# Patient Record
Sex: Female | Born: 1975
Health system: Southern US, Community
[De-identification: ages and names within clinical notes are randomized; demographics above are authoritative.]

## PROBLEM LIST (undated history)

## (undated) DIAGNOSIS — I10 Essential (primary) hypertension: Secondary | ICD-10-CM

## (undated) DIAGNOSIS — E079 Disorder of thyroid, unspecified: Secondary | ICD-10-CM

## (undated) DIAGNOSIS — L439 Lichen planus, unspecified: Secondary | ICD-10-CM

## (undated) DIAGNOSIS — K219 Gastro-esophageal reflux disease without esophagitis: Secondary | ICD-10-CM

## (undated) DIAGNOSIS — E663 Overweight: Secondary | ICD-10-CM

## (undated) HISTORY — DX: Essential (primary) hypertension: I10

## (undated) HISTORY — DX: Overweight: E66.3

## (undated) HISTORY — DX: Gastro-esophageal reflux disease without esophagitis: K21.9

## (undated) HISTORY — DX: Disorder of thyroid, unspecified: E07.9

## (undated) HISTORY — DX: Lichen planus, unspecified: L43.9

---

## 2010-08-29 ENCOUNTER — Ambulatory Visit: Payer: Self-pay | Admitting: Internal Medicine

## 2010-08-29 DIAGNOSIS — E669 Obesity, unspecified: Secondary | ICD-10-CM

## 2010-08-29 DIAGNOSIS — I10 Essential (primary) hypertension: Secondary | ICD-10-CM | POA: Insufficient documentation

## 2010-09-03 ENCOUNTER — Ambulatory Visit: Payer: Self-pay | Admitting: Family Medicine

## 2010-09-04 LAB — CONVERTED CEMR LAB
ALT: 17 units/L (ref 0–35)
BUN: 17 mg/dL (ref 6–23)
Basophils Absolute: 0 10*3/uL (ref 0.0–0.1)
Bilirubin, Direct: 0.1 mg/dL (ref 0.0–0.3)
Chloride: 106 meq/L (ref 96–112)
Cholesterol: 127 mg/dL (ref 0–200)
Creatinine, Ser: 0.8 mg/dL (ref 0.4–1.2)
Eosinophils Absolute: 0.2 10*3/uL (ref 0.0–0.7)
Eosinophils Relative: 2.8 % (ref 0.0–5.0)
Glucose, Bld: 89 mg/dL (ref 70–99)
LDL Cholesterol: 70 mg/dL (ref 0–99)
Lymphs Abs: 1.7 10*3/uL (ref 0.7–4.0)
MCV: 86.4 fL (ref 78.0–100.0)
Monocytes Absolute: 0.8 10*3/uL (ref 0.1–1.0)
Neutrophils Relative %: 66.5 % (ref 43.0–77.0)
Platelets: 230 10*3/uL (ref 150.0–400.0)
RDW: 14.3 % (ref 11.5–14.6)
TSH: 1.04 microintl units/mL (ref 0.35–5.50)
Total Bilirubin: 0.8 mg/dL (ref 0.3–1.2)
Triglycerides: 37 mg/dL (ref 0.0–149.0)
VLDL: 7.4 mg/dL (ref 0.0–40.0)
WBC: 8.2 10*3/uL (ref 4.5–10.5)

## 2010-10-30 NOTE — Assessment & Plan Note (Signed)
Summary: NEW PT TO [facility]   Vital Signs:  Patient profile:   35 year old female Height:      65 inches Weight:      170.25 pounds BMI:     28.43 Temp:     99.0 degrees F oral Pulse rate:   86 / minute Pulse rhythm:   regular BP sitting:   136 / 82  (left arm) Cuff size:   regular  Vitals Entered By: Selena Batten Dance CMA Duncan Dull) (August 29, 2010 10:19 AM) CC: New patient to establish care   History of Present Illness: CC: new patient, establish  developed cold, cough remained.  chest started hurting.  today feeling better.  sometimes feels heart pounding.  Feels chest soreness.    elevated bp in past - at [facility] has been 150-160 systolic.  overweight - knows needs to get into gym.    preventative -  flu shot last season, not currently.  will think about it. unsure about tetanus. hasn't had blood work checked in past. Well woman at [facility] - Dr. Abbe Amsterdam at [facility] physicians.  h/o abnl paps, removed abnl cells s/p LEEP.  Has had normal paps since then.  h/o preterm labor at 21 6 wks, lost son, twin daughter doing well.  Current Medications (verified): 1)  Depo-Provera 150 Mg/ml Susp (Medroxyprogesterone Acetate)  Allergies (verified): No Known Drug Allergies  Past History:  Past Medical History: Preterm labor  Past Surgical History: none  Family History: M: healthy F: DM, HTN MGM: CAD/MI 30yo PGM, aunt: BRCA (dx 64s) PGM: HTN  No CVA, other CA  Social History: No smokers, occasional EtOH, no rec drugs Caffeine: 3-4 cups coffee/day Occupation: Charity fundraiser at [employer], [address] Lives with husband and 1 healthy daughter at home (21wk premie, 2007), lost twin son, ab other twin pregnancy  Review of Systems  The patient denies anorexia, fever, weight loss, weight gain, vision loss, decreased hearing, hoarseness, chest pain, syncope, dyspnea on exertion, peripheral edema, prolonged cough, headaches, hemoptysis, abdominal pain, melena, hematochezia, severe indigestion/heartburn,  hematuria, difficulty walking, depression, and breast masses.    Physical Exam  General:  Well-developed,well-nourished,in no acute distress; alert,appropriate and cooperative throughout examination Head:  Normocephalic and atraumatic without obvious abnormalities. No apparent alopecia or balding. Eyes:  No corneal or conjunctival inflammation noted. EOMI. Perrla.  Ears:  TMs clear bilaterally Nose:  nares clear Mouth:  Oral mucosa and oropharynx without lesions or exudates.  Teeth in good repair.  MMM Neck:  No deformities, masses, or tenderness noted. Lungs:  Normal respiratory effort, chest expands symmetrically. Lungs are clear to auscultation, no crackles or wheezes. Heart:  Normal rate and regular rhythm. S1 and S2 normal without gallop, murmur, click, rub or other extra sounds. Abdomen:  Bowel sounds positive,abdomen soft and non-tender without masses, organomegaly or hernias noted. Msk:  No deformity or scoliosis noted of thoracic or lumbar spine.   Pulses:  2+ rad pulses Extremities:  no pedal edema Neurologic:  CN grossly intact, station and gait intact Skin:  Intact without suspicious lesions or rashes Psych:  full affect, pleasant and conversant   Impression & Recommendations:  Problem # 1:  ELEVATED BLOOD PRESSURE WITHOUT DIAGNOSIS OF HYPERTENSION (ICD-796.2) to keep log of bp at work, bring to next visit.  Instructed in low sodium diet  and behavior modification.    BP today: 136/82  Problem # 2:  OVERWEIGHT (ICD-278.02) check basic blood work when returns fasting.  discussed healthy eating and exercise, fm hx HTN, DM. Ht:  65 (08/29/2010)   Wt: 170.25 (08/29/2010)   BMI: 28.43 (08/29/2010)  Problem # 3:  CONTRACEPTIVE MANAGEMENT (ICD-V25.09) pt requests to receive depo here, will request records from Dr. Abbe Amsterdam at [facility] and may start next depo cycle.  Problem # 4:  HEALTH MAINTENANCE EXAM (ICD-V70.0) declines flu, will check on tetanus.  well woman - gets done at  [facility], last over summer.  normal paps per patient.  Complete Medication List: 1)  Depo-provera 150 Mg/ml Susp (Medroxyprogesterone acetate)  Patient Instructions: 1)  return fasting for blood work [CBC, CMP, FLP, TSH 796.2, 278.02] 2)  Good to meet you today! Call clinic with questions 3)  Return in 3-4 wks for blood pressure recheck.  Bring log.     Orders Added: 1)  New Patient 18-39 years [[id number]]    Prior Medications: Current Allergies (reviewed today): No known allergies

## 2012-09-30 DIAGNOSIS — L439 Lichen planus, unspecified: Secondary | ICD-10-CM

## 2012-09-30 HISTORY — DX: Lichen planus, unspecified: L43.9

## 2013-04-29 ENCOUNTER — Ambulatory Visit (INDEPENDENT_AMBULATORY_CARE_PROVIDER_SITE_OTHER): Payer: BC Managed Care – PPO | Admitting: Family Medicine

## 2013-04-29 ENCOUNTER — Encounter: Payer: Self-pay | Admitting: Family Medicine

## 2013-04-29 VITALS — BP 150/90 | HR 80 | Temp 98.7°F | Ht 65.0 in | Wt 164.5 lb

## 2013-04-29 DIAGNOSIS — E663 Overweight: Secondary | ICD-10-CM

## 2013-04-29 DIAGNOSIS — Z0100 Encounter for examination of eyes and vision without abnormal findings: Secondary | ICD-10-CM

## 2013-04-29 DIAGNOSIS — Z0000 Encounter for general adult medical examination without abnormal findings: Secondary | ICD-10-CM

## 2013-04-29 DIAGNOSIS — Z0279 Encounter for issue of other medical certificate: Secondary | ICD-10-CM

## 2013-04-29 DIAGNOSIS — R030 Elevated blood-pressure reading, without diagnosis of hypertension: Secondary | ICD-10-CM

## 2013-04-29 DIAGNOSIS — Z0110 Encounter for examination of ears and hearing without abnormal findings: Secondary | ICD-10-CM

## 2013-04-29 LAB — POCT URINALYSIS DIPSTICK
Bilirubin, UA: NEGATIVE
Glucose, UA: NEGATIVE
Ketones, UA: NEGATIVE
Nitrite, UA: NEGATIVE

## 2013-04-29 NOTE — Assessment & Plan Note (Signed)
Preventative protocols reviewed and updated unless pt declined. Discussed healthy diet and lifestyle.  Await [facility] to fill out form.

## 2013-04-29 NOTE — Patient Instructions (Addendum)
Let's keep track of blood pressure at local pharmacy - let me know if consistently >140/90 Blood work today. Urine looking ok. We will call you when results are in and form is ready to pick up

## 2013-04-29 NOTE — Progress Notes (Signed)
Subjective:    Patient ID: Jasmine Dixon, female    DOB: Jul 08, 1976, 37 y.o.   MRN: [id number]  HPI CC: CPE  Jasmine Dixon presents today for physical and to fill out form for school.  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  Wants to do FM.  Works [employer]00 at [employer].  BP elevated today - works night shifts.  Attributes to this.    Seat belt use discussed Avoids sun.  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 Abbe Amsterdam at [facility] physicians.  Normal pap smear recently. h/o abnl paps, removed abnl cells s/p LEEP. Has had normal paps since then.  h/o preterm labor at 21 6 wks, lost son, twin daughter doing well.   Medications and allergies reviewed and updated in chart.  Past histories reviewed and updated if relevant as below. Patient Active Problem List   Diagnosis Date Noted  . OVERWEIGHT 08/29/2010  . ELEVATED BLOOD PRESSURE WITHOUT DIAGNOSIS OF HYPERTENSION 08/29/2010   Past Medical History  Diagnosis Date  . Elevated blood pressure reading without diagnosis of hypertension   . Overweight(278.02)   . Preterm labor    History reviewed. No pertinent past surgical history. History  Substance Use Topics  . Smoking status: Never Smoker   . Smokeless tobacco: Never Used  . Alcohol Use: Yes     Comment: Occasional   Family History  Problem Relation Age of Onset  . Diabetes Father   . Hypertension Father   . CAD Maternal Grandmother   . Heart attack Maternal Grandmother 30  . Breast cancer Paternal Grandmother 95  . Breast cancer Paternal Aunt 32  . Hypertension Paternal Grandmother    No Known Allergies No current outpatient prescriptions on file prior to visit.   No current facility-administered  medications on file prior to visit.    Review of Systems  Constitutional: Negative for fever, chills, activity change, appetite change, fatigue and unexpected weight change.  HENT: Negative for hearing loss and neck pain.   Eyes: Negative for visual disturbance.  Respiratory: Negative for cough, chest tightness, shortness of breath and wheezing.   Cardiovascular: Negative for chest pain, palpitations and leg swelling.  Gastrointestinal: Negative for nausea, vomiting, abdominal pain, diarrhea, constipation, blood in stool and abdominal distention.  Genitourinary: Negative for hematuria and difficulty urinating.  Musculoskeletal: Negative for myalgias and arthralgias.  Skin: Negative for rash.  Neurological: Negative for dizziness, seizures, syncope and headaches.  Hematological: Negative for adenopathy. Does not bruise/bleed easily.  Psychiatric/Behavioral: Negative for dysphoric mood. The patient is not nervous/anxious.        Objective:   Physical Exam  Nursing note and vitals reviewed. Constitutional: She is oriented to person, place, and time. She appears well-developed and well-nourished. No distress.  HENT:  Head: Normocephalic and atraumatic.  Right Ear: Hearing, tympanic membrane, external ear and ear canal normal.  Left Ear: Hearing, tympanic membrane, external ear and ear canal normal.  Nose: Nose normal.  Mouth/Throat: Oropharynx is clear and moist. No oropharyngeal exudate.  Eyes: Conjunctivae and EOM are normal. Pupils are equal, round, and reactive to light. No scleral icterus.  Neck: Normal range of motion. Neck supple. No thyromegaly present.  Cardiovascular: Normal rate, regular rhythm, normal heart sounds and intact  distal pulses.   No murmur heard. Pulses:      Radial pulses are 2+ on the right side, and 2+ on the left side.  Pulmonary/Chest: Effort normal and breath sounds normal. No respiratory distress. She has no wheezes. She has no rales.  Abdominal: Soft.  Bowel sounds are normal. She exhibits no distension and no mass. There is no tenderness. There is no rebound and no guarding.  Musculoskeletal: Normal range of motion. She exhibits no edema.  Lymphadenopathy:    She has no cervical adenopathy.  Neurological: She is alert and oriented to person, place, and time.  CN grossly intact, station and gait intact  Skin: Skin is warm and dry. No rash noted.  Psychiatric: She has a normal mood and affect. Her behavior is normal. Judgment and thought content normal.       Assessment & Plan:

## 2013-04-29 NOTE — Assessment & Plan Note (Signed)
Body mass index is 27.37 kg/(m^2).  overall doing well. Wt Readings from Last 3 Encounters:  04/29/13 164 lb 8 oz (74.617 kg)  08/29/10 170 lb 4 oz (77.225 kg)

## 2013-04-29 NOTE — Assessment & Plan Note (Signed)
Pt will monitor closely at work, and notify me if persistently elevated. Check Cr and microalb today.

## 2013-04-30 LAB — MICROALBUMIN / CREATININE URINE RATIO
Creatinine,U: 193 mg/dL
Microalb Creat Ratio: 0.3 mg/g (ref 0.0–30.0)
Microalb, Ur: 0.5 mg/dL (ref 0.0–1.9)

## 2013-04-30 LAB — BASIC METABOLIC PANEL
BUN: 12 mg/dL (ref 6–23)
CO2: 24 mEq/L (ref 19–32)
Calcium: 9.3 mg/dL (ref 8.4–10.5)
Chloride: 107 mEq/L (ref 96–112)
Creatinine, Ser: 0.9 mg/dL (ref 0.4–1.2)
GFR: 76.63 mL/min (ref >60.00)
Glucose, Bld: 79 mg/dL (ref 70–99)
Potassium: 4.3 mEq/L (ref 3.5–5.1)
Sodium: 139 mEq/L (ref 135–145)

## 2013-04-30 LAB — CBC WITH DIFFERENTIAL/PLATELET
Basophils Absolute: 0 10*3/uL (ref 0.0–0.1)
Basophils Relative: 0.4 % (ref 0.0–3.0)
Eosinophils Absolute: 0.3 10*3/uL (ref 0.0–0.7)
Hemoglobin: 12.8 g/dL (ref 12.0–15.0)
Lymphocytes Relative: 30.7 % (ref 12.0–46.0)
MCHC: 32.5 g/dL (ref 30.0–36.0)
Monocytes Relative: 9.5 % (ref 3.0–12.0)
Neutro Abs: 4.1 10*3/uL (ref 1.4–7.7)
Neutrophils Relative %: 55.3 % (ref 43.0–77.0)
RBC: 4.47 Mil/uL (ref 3.87–5.11)
RDW: 13.7 % (ref 11.5–14.6)

## 2013-05-03 ENCOUNTER — Encounter: Payer: Self-pay | Admitting: *Deleted

## 2013-05-04 ENCOUNTER — Telehealth: Payer: Self-pay | Admitting: Family Medicine

## 2013-05-04 NOTE — Telephone Encounter (Signed)
Pt states that she got lab results in mail today.  Form that she brought during physical for school was supposed to be in with her labs but she did not receive it.  She says that she will come and pick it up if needed.

## 2013-05-04 NOTE — Telephone Encounter (Signed)
Dr. Reece Agar had not finished with paperwork when I mailed results. Patient notified and will pick up forms tomorrow.

## 2014-09-26 ENCOUNTER — Ambulatory Visit (INDEPENDENT_AMBULATORY_CARE_PROVIDER_SITE_OTHER): Payer: BC Managed Care – PPO | Admitting: Internal Medicine

## 2014-09-26 ENCOUNTER — Encounter: Payer: Self-pay | Admitting: Internal Medicine

## 2014-09-26 VITALS — BP 162/100 | HR 120 | Temp 98.1°F | Wt 185.0 lb

## 2014-09-26 DIAGNOSIS — I10 Essential (primary) hypertension: Secondary | ICD-10-CM

## 2014-09-26 DIAGNOSIS — R002 Palpitations: Secondary | ICD-10-CM

## 2014-09-26 LAB — COMPREHENSIVE METABOLIC PANEL
ALT: 18 U/L (ref 0–35)
AST: 23 U/L (ref 0–37)
Albumin: 4.5 g/dL (ref 3.5–5.2)
Alkaline Phosphatase: 48 U/L (ref 39–117)
BILIRUBIN TOTAL: 0.3 mg/dL (ref 0.2–1.2)
BUN: 11 mg/dL (ref 6–23)
CO2: 24 meq/L (ref 19–32)
CREATININE: 0.8 mg/dL (ref 0.4–1.2)
Calcium: 9.3 mg/dL (ref 8.4–10.5)
Chloride: 105 mEq/L (ref 96–112)
GFR: 87.24 mL/min (ref >60.00)
GLUCOSE: 91 mg/dL (ref 70–99)
Potassium: 3.9 mEq/L (ref 3.5–5.1)
Sodium: 136 mEq/L (ref 135–145)
Total Protein: 8 g/dL (ref 6.0–8.3)

## 2014-09-26 LAB — CBC
HEMATOCRIT: 40.8 % (ref 36.0–46.0)
Hemoglobin: 13.1 g/dL (ref 12.0–15.0)
MCHC: 32 g/dL (ref 30.0–36.0)
MCV: 87.2 fl (ref 78.0–100.0)
Platelets: 284 10*3/uL (ref 150.0–400.0)
RBC: 4.68 Mil/uL (ref 3.87–5.11)
RDW: 13.4 % (ref 11.5–15.5)
WBC: 11.2 10*3/uL — AB (ref 4.0–10.5)

## 2014-09-26 LAB — MAGNESIUM: Magnesium: 1.8 mg/dL (ref 1.5–2.5)

## 2014-09-26 LAB — TSH: TSH: 0.91 u[IU]/mL (ref 0.35–4.50)

## 2014-09-26 MED ORDER — LISINOPRIL-HYDROCHLOROTHIAZIDE 10-12.5 MG PO TABS: 1.0000 | ORAL_TABLET | Freq: Every day | ORAL | Status: DC

## 2014-09-26 NOTE — Progress Notes (Signed)
Pre visit review using our clinic review tool, if applicable. No additional management support is needed unless otherwise documented below in the visit note. 

## 2014-09-26 NOTE — Patient Instructions (Signed)

## 2014-09-26 NOTE — Progress Notes (Signed)
Subjective:    Patient ID: Jasmine Dixon, female    DOB: 30-Nov-1975, 38 y.o.   MRN: [id number]  HPI  Pt presents to the clinic today with c/o palpitations. She reports this started  1 month ago. It is intermittent but has been occuring more frequently over the last week. She denies blurred vision, dizziness, chest pain or shortness of breath. She does reports being "stressed out" due to school. Of note, her BP today is 162/100. She did have elevated blood pressure at her last visit 03/2013 of 150/90. She has also gained 20 lbs over that course of time. She has never been medicated for high blood pressure. She has not tried anything OTC.  Review of Systems      Past Medical History  Diagnosis Date  . Elevated blood pressure reading without diagnosis of hypertension   . Overweight(278.02)   . Preterm labor     Current Outpatient Prescriptions  Medication Sig Dispense Refill  . SPRINTEC 28 0.25-35 MG-MCG tablet Take 1 tablet by mouth daily.  11   No current facility-administered medications for this visit.    No Known Allergies  Family History  Problem Relation Age of Onset  . Diabetes Father   . Hypertension Father   . CAD Maternal Grandmother   . Heart attack Maternal Grandmother 30  . Breast cancer Paternal Grandmother 26  . Breast cancer Paternal Aunt 6  . Hypertension Paternal Grandmother     History   Social History  . Marital Status: Married    Spouse Name: N/A    Number of Children: 1  . Years of Education: N/A   Occupational History  . RN    Social History Main Topics  . Smoking status: Never Smoker   . Smokeless tobacco: Never Used  . Alcohol Use: 0.0 oz/week    0 Not specified per week     Comment: Occasional  . Drug Use: No  . Sexual Activity: Not on file   Other Topics Concern  . Not on file   Social History Narrative   3-4 cups coffee/day   Lives with husband and 1 healthy daughter at home (21 week preemie,2007), lost twin son,ab other  twin pregnacy   Occ: RN at [employer]   Activity: gym, has Physiological scientist   Diet: good water, fruits/vegetables daily, weight watchers     Constitutional: Pt reports weight gain. Denies fever, malaise, fatigue, headache.  Respiratory: Denies difficulty breathing, shortness of breath, cough or sputum production.   Cardiovascular: Pt reports palpitations. Denies chest pain, chest tightness or swelling in the hands or feet.  Neurological: Denies dizziness, difficulty with memory, difficulty with speech or problems with balance and coordination.   No other specific complaints in a complete review of systems (except as listed in HPI above).  Objective:   Physical Exam  BP 162/100 mmHg  Pulse 120  Temp(Src) 98.1 F (36.7 C) (Oral)  Wt 185 lb (83.915 kg)  SpO2 99%  LMP 09/14/2014 Wt Readings from Last 3 Encounters:  09/26/14 185 lb (83.915 kg)  04/29/13 164 lb 8 oz (74.617 kg)  08/29/10 170 lb 4 oz (77.225 kg)    General: Appears  her stated age, obese in NAD. Neck:  Neck supple, trachea midline. No masses, lumps or thyromegaly present.  Cardiovascular: Normal rate and rhythm. S1,S2 noted.  No murmur, rubs or gallops noted. No JVD or BLE edema. No carotid bruits noted. Pulmonary/Chest: Normal effort and positive vesicular breath sounds. No respiratory distress.  No wheezes, rales or ronchi noted.  Neurological: Alert and oriented.   BMET    Component Value Date/Time   NA 139 04/29/2013 1545   K 4.3 04/29/2013 1545   CL 107 04/29/2013 1545   CO2 24 04/29/2013 1545   GLUCOSE 79 04/29/2013 1545   BUN 12 04/29/2013 1545   CREATININE 0.9 04/29/2013 1545   CALCIUM 9.3 04/29/2013 1545   GFRNONAA 86.80 09/03/2010 0841    Lipid Panel     Component Value Date/Time   CHOL 127 09/03/2010 0841   TRIG 37.0 09/03/2010 0841   HDL 49.70 09/03/2010 0841   CHOLHDL 3 09/03/2010 0841   VLDL 7.4 09/03/2010 0841   LDLCALC 70 09/03/2010 0841    CBC    Component Value Date/Time   WBC  7.4 04/29/2013 1545   RBC 4.47 04/29/2013 1545   HGB 12.8 04/29/2013 1545   HCT 39.3 04/29/2013 1545   PLT 264.0 04/29/2013 1545   MCV 87.8 04/29/2013 1545   MCHC 32.5 04/29/2013 1545   RDW 13.7 04/29/2013 1545   LYMPHSABS 2.3 04/29/2013 1545   MONOABS 0.7 04/29/2013 1545   EOSABS 0.3 04/29/2013 1545   BASOSABS 0.0 04/29/2013 1545    Hgb A1C No results found for: HGBA1C       Assessment & Plan:  HTN with tachycardia and palpitations:  ECG: sinus tach with bigeminy Will check CBC, CMET, TSH and magnesium today Will treat BP with lisinopril-HCT Work on diet and exercise  RTC in 1 month to recheck blood pressure, or sooner if palpitations/tachycardia worsen, consider adding a beta blocker

## 2014-09-27 ENCOUNTER — Telehealth: Payer: Self-pay | Admitting: Family Medicine

## 2014-09-27 NOTE — Telephone Encounter (Signed)
emmi emailed °

## 2014-09-28 ENCOUNTER — Encounter: Payer: Self-pay | Admitting: Family Medicine

## 2014-10-01 NOTE — Telephone Encounter (Signed)
Scheduled patient for 11:30am. If she is unable to come in, plz cancel appt. Thanks.

## 2014-10-03 ENCOUNTER — Encounter: Payer: Self-pay | Admitting: Family Medicine

## 2014-10-03 ENCOUNTER — Ambulatory Visit (INDEPENDENT_AMBULATORY_CARE_PROVIDER_SITE_OTHER): Payer: BC Managed Care – PPO | Admitting: Family Medicine

## 2014-10-03 VITALS — BP 118/70 | HR 102 | Temp 98.0°F | Wt 184.5 lb

## 2014-10-03 DIAGNOSIS — R002 Palpitations: Secondary | ICD-10-CM

## 2014-10-03 DIAGNOSIS — I10 Essential (primary) hypertension: Secondary | ICD-10-CM

## 2014-10-03 DIAGNOSIS — E049 Nontoxic goiter, unspecified: Secondary | ICD-10-CM

## 2014-10-03 DIAGNOSIS — R12 Heartburn: Secondary | ICD-10-CM

## 2014-10-03 DIAGNOSIS — E010 Iodine-deficiency related diffuse (endemic) goiter: Secondary | ICD-10-CM | POA: Insufficient documentation

## 2014-10-03 MED ORDER — OMEPRAZOLE 20 MG PO CPDR: 20.0000 mg | DELAYED_RELEASE_CAPSULE | Freq: Every day | ORAL | Status: DC

## 2014-10-03 MED ORDER — METOPROLOL TARTRATE 25 MG PO TABS: 25.0000 mg | ORAL_TABLET | Freq: Two times a day (BID) | ORAL | Status: DC

## 2014-10-03 MED ORDER — LISINOPRIL 10 MG PO TABS
10.0000 mg | ORAL_TABLET | Freq: Every day | ORAL | Status: DC
Start: 2014-10-03 — End: 2014-10-27

## 2014-10-03 NOTE — Patient Instructions (Signed)
Keep next appointment. Let us know sooner if recurrent palpitations. Stop lisinopril hctz. Start lisinopril 10mg  daily along with metoprolol 25mg  twice daily. Return next week for labwork to recheck kidney function on meds.

## 2014-10-03 NOTE — Assessment & Plan Note (Signed)
On exam but normal TSH last week. Will regardless order thyroid US. Pt agrees with plan. Recheck TSH, free T4 next week when returns for f/u labs

## 2014-10-03 NOTE — Progress Notes (Signed)
BP 118/70 mmHg  Pulse 102  Temp(Src) 98 F (36.7 C) (Oral)  Wt 184 lb 8 oz (83.689 kg)  LMP 09/14/2014   CC: f/u palpitations   Subjective:    Patient ID: Jasmine Dixon, female    DOB: 1976/05/16, 39 y.o.   MRN: [id number]  HPI: Jasmine Dixon is a 39 y.o. female presenting on 10/03/2014 for Follow-up   See prior note and mychart message for details. Seen here last week with hypertension and tachycardia. Started on lisinopril hctz 10/12.5mg .  Scheduled appt today due to persistent palpitations and malaise, nausea. Actually feeling better today. No more palpitations since Saturday morning.   Palpitations described as irregular and fast.   On OCP sprintec since spring 2015. No fmhx blood clots. No recent prolonged travel. No fmhx thyroid disease. Denies leg swelling, dyspnea or exertional chest discomfort.  No headaches or vision changes, no leg swelling. No heat/cold intolerance, no tremors, no bowel changes/diarrhea/constipation, skin or hair changes.  Some GERD sxs - worse with weight gain.   Brings log of BP/HR:  HR 80-90s, highest 106 at rest. BP 113-129/73-85  20lb weight gain noted. To start seeing personal trainer.   No significant caffeine intake.   Relevant past medical, surgical, family and social history reviewed and updated as indicated. Interim medical history since our last visit reviewed. Allergies and medications reviewed and updated. Current Outpatient Prescriptions on File Prior to Visit  Medication Sig  . SPRINTEC 28 0.25-35 MG-MCG tablet Take 1 tablet by mouth daily.   No current facility-administered medications on file prior to visit.    Review of Systems Per HPI unless specifically indicated above     Objective:    BP 118/70 mmHg  Pulse 102  Temp(Src) 98 F (36.7 C) (Oral)  Wt 184 lb 8 oz (83.689 kg)  LMP 09/14/2014  Wt Readings from Last 3 Encounters:  10/03/14 184 lb 8 oz (83.689 kg)  09/26/14 185 lb (83.915 kg)  04/29/13 164 lb 8  oz (74.617 kg)    Physical Exam  Constitutional: She appears well-developed and well-nourished. No distress.  HENT:  Mouth/Throat: Oropharynx is clear and moist. No oropharyngeal exudate.  Neck: Normal range of motion. Neck supple. Thyromegaly present.  Cardiovascular: Normal rate, regular rhythm, normal heart sounds and intact distal pulses.   No murmur heard. Pulmonary/Chest: Effort normal and breath sounds normal. No respiratory distress. She has no wheezes. She has no rales.  Musculoskeletal: She exhibits no edema.  Lymphadenopathy:    She has no cervical adenopathy.  Skin: Skin is warm and dry. No rash noted.  Psychiatric: She has a normal mood and affect.  Nursing note and vitals reviewed.     Assessment & Plan:   Problem List Items Addressed This Visit    Thyromegaly    On exam but normal TSH last week. Will regardless order thyroid US. Pt agrees with plan. Recheck TSH, free T4 next week when returns for f/u labs    Relevant Medications      metoprolol tartrate (LOPRESSOR) tablet   Other Relevant Orders      US Soft Tissue Head/Neck      TSH      T4, free      T3   Palpitations - Primary    Improvement noted. Continue to monitor, if persistent palpitations will consider holter monitor. Doubt blood clot related.    Relevant Orders      EKG 12-Lead (Completed)   Hypertension, essential    Improved control  noted with lisinopril/hctz, and improvement of palpitations as well but persistent tachycardia noted. Will d/c combo pill, start plain lisinopril 10mg  and metformin 25mg  bid. Keep appt at end of month for recheck EKG and if persistent LVH/LAE, consider echocardiogram. Pt agrees with plan. EKG today - NSR rate high 90s, normal axis, intervals, LAE, ?LVH, nonspecific T changes    Relevant Medications      metoprolol tartrate (LOPRESSOR) tablet      lisinopril (PRINIVIL,ZESTRIL) tablet   Other Relevant Orders      TSH      Basic metabolic panel   Heartburn     Noticed increased sxs recently. Will start omeprazole 20mg  OTC daily.        Follow up plan: Return if symptoms worsen or fail to improve, for keep scheduled appointment.

## 2014-10-03 NOTE — Progress Notes (Signed)
Pre visit review using our clinic review tool, if applicable. No additional management support is needed unless otherwise documented below in the visit note. 

## 2014-10-04 ENCOUNTER — Encounter: Payer: Self-pay | Admitting: Family Medicine

## 2014-10-04 DIAGNOSIS — K219 Gastro-esophageal reflux disease without esophagitis: Secondary | ICD-10-CM | POA: Insufficient documentation

## 2014-10-04 NOTE — Assessment & Plan Note (Addendum)
Improvement noted. Continue to monitor, if persistent palpitations will consider holter monitor. Doubt blood clot related.

## 2014-10-04 NOTE — Assessment & Plan Note (Signed)
Improved control noted with lisinopril/hctz, and improvement of palpitations as well but persistent tachycardia noted. Will d/c combo pill, start plain lisinopril 10mg  and metformin 25mg  bid. Keep appt at end of month for recheck EKG and if persistent LVH/LAE, consider echocardiogram. Pt agrees with plan. EKG today - NSR rate high 90s, normal axis, intervals, LAE, ?LVH, nonspecific T changes

## 2014-10-04 NOTE — Assessment & Plan Note (Signed)
Noticed increased sxs recently. Will start omeprazole 20mg  OTC daily.

## 2014-10-05 ENCOUNTER — Ambulatory Visit
Admission: RE | Admit: 2014-10-05 | Discharge: 2014-10-05 | Disposition: A | Discharge status: Home / Self Care | Payer: BC Managed Care – PPO | Source: Ambulatory Visit | Attending: Family Medicine | Admitting: Family Medicine

## 2014-10-05 DIAGNOSIS — E010 Iodine-deficiency related diffuse (endemic) goiter: Secondary | ICD-10-CM

## 2014-10-11 ENCOUNTER — Other Ambulatory Visit (INDEPENDENT_AMBULATORY_CARE_PROVIDER_SITE_OTHER): Payer: BC Managed Care – PPO

## 2014-10-11 DIAGNOSIS — E010 Iodine-deficiency related diffuse (endemic) goiter: Secondary | ICD-10-CM

## 2014-10-11 DIAGNOSIS — E049 Nontoxic goiter, unspecified: Secondary | ICD-10-CM

## 2014-10-11 DIAGNOSIS — I10 Essential (primary) hypertension: Secondary | ICD-10-CM

## 2014-10-11 LAB — T4, FREE: Free T4: 0.87 ng/dL (ref 0.60–1.60)

## 2014-10-11 LAB — BASIC METABOLIC PANEL
BUN: 9 mg/dL (ref 6–23)
CO2: 23 meq/L (ref 19–32)
CREATININE: 0.8 mg/dL (ref 0.4–1.2)
Calcium: 9 mg/dL (ref 8.4–10.5)
Chloride: 104 mEq/L (ref 96–112)
GFR: 87.22 mL/min (ref >60.00)
Glucose, Bld: 81 mg/dL (ref 70–99)
Potassium: 3.8 mEq/L (ref 3.5–5.1)
SODIUM: 133 meq/L — AB (ref 135–145)

## 2014-10-11 LAB — TSH: TSH: 2.11 u[IU]/mL (ref 0.35–4.50)

## 2014-10-12 LAB — T3: T3 TOTAL: 164.3 ng/dL (ref 80.0–204.0)

## 2014-10-27 ENCOUNTER — Ambulatory Visit (INDEPENDENT_AMBULATORY_CARE_PROVIDER_SITE_OTHER): Payer: BC Managed Care – PPO | Admitting: Family Medicine

## 2014-10-27 ENCOUNTER — Encounter: Payer: Self-pay | Admitting: Family Medicine

## 2014-10-27 VITALS — BP 102/68 | HR 82 | Temp 98.2°F | Wt 181.5 lb

## 2014-10-27 DIAGNOSIS — I10 Essential (primary) hypertension: Secondary | ICD-10-CM

## 2014-10-27 DIAGNOSIS — E049 Nontoxic goiter, unspecified: Secondary | ICD-10-CM

## 2014-10-27 DIAGNOSIS — R002 Palpitations: Secondary | ICD-10-CM

## 2014-10-27 DIAGNOSIS — R9431 Abnormal electrocardiogram [ECG] [EKG]: Secondary | ICD-10-CM

## 2014-10-27 DIAGNOSIS — E010 Iodine-deficiency related diffuse (endemic) goiter: Secondary | ICD-10-CM

## 2014-10-27 MED ORDER — LISINOPRIL 5 MG PO TABS: 5.0000 mg | ORAL_TABLET | Freq: Every day | ORAL | Status: DC

## 2014-10-27 NOTE — Patient Instructions (Addendum)
I think we are doing well. EKG today. Let's decrease lisinopril to 5mg  daily (1/2 tablet until you run out then 5mg  dose will be at pharmacy). Return for physical in next few months.

## 2014-10-27 NOTE — Assessment & Plan Note (Signed)
Better control - a bit low today. Will decrease lisinopril to 5mg  daily, continue metoprolol 25mg  bid. Recheck in 3 months at CPE.

## 2014-10-27 NOTE — Progress Notes (Signed)
Pre visit review using our clinic review tool, if applicable. No additional management support is needed unless otherwise documented below in the visit note. 

## 2014-10-27 NOTE — Progress Notes (Addendum)
BP 102/68 mmHg  Pulse 82  Temp(Src) 98.2 F (36.8 C) (Oral)  Wt 181 lb 8 oz (82.328 kg)  LMP 10/19/2014   CC: 3wk f/u visit  Subjective:    Patient ID: Jasmine Dixon, female    DOB: [birth year], 39 y.o.   MRN: [id number]  HPI: Jasmine Dixon is a 39 y.o. female presenting on 10/27/2014 for Follow-up   See prior notes for details. Briefly, seen here x2 in last month with hypertension and tachycardia. Labwork returned overall normal including TSH and Hgb. We started her on lisinopril 10mg  daily and metoprolol 25mg  bid. Since we saw her, minimal palpitations maybe 2-3 in last few weeks,   bp at home ranging 110s/70s. Denies dizziness, headaches, chest pain, dyspnea.  EKG showed possible LVH/LAE - due for repeat and if abnormal plan was to obtain echocardiogram.   We also started omeprazole 20mg  OTC daily for endorsed heartburn sxs. Some persistent globus sensation despite omeprazole 20mg  daily for last 3 weeks. Never really hearburn or water brash, or acid reflux.  Relevant past medical, surgical, family and social history reviewed and updated as indicated. Interim medical history since our last visit reviewed. Allergies and medications reviewed and updated. Current Outpatient Prescriptions on File Prior to Visit  Medication Sig  . metoprolol tartrate (LOPRESSOR) 25 MG tablet Take 1 tablet (25 mg total) by mouth 2 (two) times daily.  Marland Kitchen omeprazole (PRILOSEC) 20 MG capsule Take 1 capsule (20 mg total) by mouth daily.  . SPRINTEC 28 0.25-35 MG-MCG tablet Take 1 tablet by mouth daily.   No current facility-administered medications on file prior to visit.    Review of Systems Per HPI unless specifically indicated above     Objective:    BP 102/68 mmHg  Pulse 82  Temp(Src) 98.2 F (36.8 C) (Oral)  Wt 181 lb 8 oz (82.328 kg)  LMP 10/19/2014  Wt Readings from Last 3 Encounters:  10/27/14 181 lb 8 oz (82.328 kg)  10/03/14 184 lb 8 oz (83.689 kg)  09/26/14 185 lb (83.915 kg)      Physical Exam  Constitutional: She appears well-developed and well-nourished. No distress.  HENT:  Mouth/Throat: Oropharynx is clear and moist. No oropharyngeal exudate.  Eyes: Conjunctivae and EOM are normal. Pupils are equal, round, and reactive to light.  Neck: Normal range of motion. Neck supple. No thyromegaly present.  Cardiovascular: Normal rate, regular rhythm, normal heart sounds and intact distal pulses.  Exam reveals no gallop.   No murmur heard. Pulmonary/Chest: Effort normal and breath sounds normal. No respiratory distress. She has no wheezes. She has no rales.  Musculoskeletal: She exhibits no edema.  Skin: Skin is warm and dry.  Psychiatric: She has a normal mood and affect.  Nursing note and vitals reviewed.  Results for orders placed or performed in visit on 10/11/14  TSH  Result Value Ref Range   TSH 2.11 0.35 - 4.50 uIU/mL  T4, free  Result Value Ref Range   Free T4 0.87 0.60 - 1.60 ng/dL  T3  Result Value Ref Range   T3, Total 164.3 80.0 - 204.0 ng/dL  Basic metabolic panel  Result Value Ref Range   Sodium 133 (L) 135 - 145 mEq/L   Potassium 3.8 3.5 - 5.1 mEq/L   Chloride 104 96 - 112 mEq/L   CO2 23 19 - 32 mEq/L   Glucose, Bld 81 70 - 99 mg/dL   BUN 9 6 - 23 mg/dL   Creatinine, Ser 0.8 0.4 - 1.2  mg/dL   Calcium 9.0 8.4 - 10.5 mg/dL   GFR 87.22 >60.00 mL/min      Assessment & Plan:   Problem List Items Addressed This Visit    RESOLVED: Thyromegaly    Thyroid US normal.      Palpitations - Primary    Initial EKG with ventricular bigeminy.  Rpt EKG with ?LAE - will rpt today and if persistently abnormal will check 2D echo.  If persistent tachyarrhythmias would refer for holter monitor - seems currently controlled with low dose beta blockade. Pt agrees with plan.   EKG - NSR rate 90s, normal axis, intervals, no acute ST/T changes, LVH, ?persistent LAE with p mitrale      Relevant Orders   EKG 12-Lead (Completed)   2D Echocardiogram without  contrast   Hypertension, essential    Better control - a bit low today. Will decrease lisinopril to 5mg  daily, continue metoprolol 25mg  bid. Recheck in 3 months at CPE.      Relevant Medications   lisinopril (PRINIVIL,ZESTRIL) tablet   Other Relevant Orders   2D Echocardiogram without contrast    Other Visit Diagnoses    Nonspecific abnormal electrocardiogram (ECG) (EKG)        Relevant Orders    2D Echocardiogram without contrast        Follow up plan: Return in about 3 months (around 01/26/2015), or if symptoms worsen or fail to improve, for annual exam, prior fasting for blood work.

## 2014-10-27 NOTE — Assessment & Plan Note (Signed)
Thyroid US normal

## 2014-10-27 NOTE — Assessment & Plan Note (Addendum)
Initial EKG with ventricular bigeminy.  Rpt EKG with ?LAE - will rpt today and if persistently abnormal will check 2D echo.  If persistent tachyarrhythmias would refer for holter monitor - seems currently controlled with low dose beta blockade. Pt agrees with plan.   EKG - NSR rate 90s, normal axis, intervals, no acute ST/T changes, LVH, ?persistent LAE with p mitrale

## 2014-10-30 NOTE — Addendum Note (Signed)
Addended by: Ria Bush on: 10/30/2014 08:17 PM   Modules accepted: Orders

## 2014-10-31 HISTORY — PX: US ECHOCARDIOGRAPHY: HXRAD669

## 2014-11-01 ENCOUNTER — Other Ambulatory Visit: Payer: Self-pay | Admitting: Radiology

## 2014-11-02 ENCOUNTER — Other Ambulatory Visit: Payer: Self-pay | Admitting: Radiology

## 2014-11-02 DIAGNOSIS — R9431 Abnormal electrocardiogram [ECG] [EKG]: Secondary | ICD-10-CM

## 2014-11-07 ENCOUNTER — Encounter: Payer: Self-pay | Admitting: Family Medicine

## 2014-11-21 ENCOUNTER — Other Ambulatory Visit: Payer: Self-pay

## 2014-11-21 ENCOUNTER — Encounter: Payer: Self-pay | Admitting: Family Medicine

## 2014-11-21 ENCOUNTER — Other Ambulatory Visit (INDEPENDENT_AMBULATORY_CARE_PROVIDER_SITE_OTHER): Payer: BC Managed Care – PPO

## 2014-11-21 DIAGNOSIS — R002 Palpitations: Secondary | ICD-10-CM

## 2014-11-21 DIAGNOSIS — R9431 Abnormal electrocardiogram [ECG] [EKG]: Secondary | ICD-10-CM

## 2015-01-28 ENCOUNTER — Other Ambulatory Visit: Payer: Self-pay | Admitting: Family Medicine

## 2015-01-28 DIAGNOSIS — I10 Essential (primary) hypertension: Secondary | ICD-10-CM

## 2015-01-30 ENCOUNTER — Other Ambulatory Visit (INDEPENDENT_AMBULATORY_CARE_PROVIDER_SITE_OTHER): Payer: BC Managed Care – PPO

## 2015-01-30 DIAGNOSIS — I10 Essential (primary) hypertension: Secondary | ICD-10-CM | POA: Diagnosis not present

## 2015-01-30 LAB — BASIC METABOLIC PANEL
BUN: 9 mg/dL (ref 6–23)
CO2: 26 mEq/L (ref 19–32)
Calcium: 8.9 mg/dL (ref 8.4–10.5)
Chloride: 103 mEq/L (ref 96–112)
Creatinine, Ser: 0.8 mg/dL (ref 0.40–1.20)
GFR: 85.82 mL/min (ref >60.00)
GLUCOSE: 81 mg/dL (ref 70–99)
POTASSIUM: 3.6 meq/L (ref 3.5–5.1)
Sodium: 136 mEq/L (ref 135–145)

## 2015-01-30 LAB — LIPID PANEL
CHOLESTEROL: 138 mg/dL (ref 0–200)
HDL: 44.2 mg/dL (ref >39.00)
LDL Cholesterol: 83 mg/dL (ref 0–99)
NonHDL: 93.8
Total CHOL/HDL Ratio: 3
Triglycerides: 54 mg/dL (ref 0.0–149.0)
VLDL: 10.8 mg/dL (ref 0.0–40.0)

## 2015-02-01 ENCOUNTER — Ambulatory Visit (INDEPENDENT_AMBULATORY_CARE_PROVIDER_SITE_OTHER): Payer: BC Managed Care – PPO | Admitting: Family Medicine

## 2015-02-01 ENCOUNTER — Encounter: Payer: Self-pay | Admitting: Family Medicine

## 2015-02-01 VITALS — BP 124/82 | HR 80 | Temp 98.0°F | Ht 65.0 in | Wt 181.2 lb

## 2015-02-01 DIAGNOSIS — I10 Essential (primary) hypertension: Secondary | ICD-10-CM

## 2015-02-01 DIAGNOSIS — Z0000 Encounter for general adult medical examination without abnormal findings: Secondary | ICD-10-CM

## 2015-02-01 DIAGNOSIS — E663 Overweight: Secondary | ICD-10-CM

## 2015-02-01 DIAGNOSIS — M205X9 Other deformities of toe(s) (acquired), unspecified foot: Secondary | ICD-10-CM

## 2015-02-01 DIAGNOSIS — M21629 Bunionette of unspecified foot: Secondary | ICD-10-CM | POA: Insufficient documentation

## 2015-02-01 DIAGNOSIS — R002 Palpitations: Secondary | ICD-10-CM

## 2015-02-01 MED ORDER — NORGESTIMATE-ETH ESTRADIOL 0.25-35 MG-MCG PO TABS: 1.0000 | ORAL_TABLET | Freq: Every day | ORAL | Status: DC

## 2015-02-01 NOTE — Assessment & Plan Note (Addendum)
Persistent issue - initial EKG 08/2014 with ?ventricular bigeminy. Echo overall normal, mild dilated LV but normal EF.  Palpitations may have recently been exacerbated after restarting workout routine last week.  Given persistent palpitations, will refer to cards for further evaluation ?holter.  Would consider titrating metoprolol and d/c lisinopril.

## 2015-02-01 NOTE — Progress Notes (Signed)
BP 124/82 mmHg  Pulse 80  Temp(Src) 98 F (36.7 C) (Oral)  Ht 5\' 5"  (1.651 m)  Wt 181 lb 4 oz (82.214 kg)  BMI 30.16 kg/m2  LMP 01/24/2015   CC: CPE  Subjective:    Patient ID: Jasmine Dixon, female    DOB: Mar 15, 1976, 39 y.o.   MRN: [id number]  HPI: Jasmine Dixon is a 39 y.o. female presenting on 02/01/2015 for Annual Exam   Persistent intermittent palpitations that last 2-3 hours. May have flared after she restarted workout routine with trainer last week. See prior notes - unrevealing workup with EKG Echo and labs.   Bilateral lateral foot pain unable to wear heels 2/2 pain.   Preventative: Well woman at [facility] - Dr. Georgiann Cocker at [facility] physicians. h/o abnl paps, removed abnl cells s/p LEEP. Normal since, now sees Q3 years. Flu shot yearly at work Tdap - 09/2012 Seat belt use discussed Avoids sun.  h/o preterm labor at 21 6 wks, lost son, twin daughter doing well.  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 Interim medical history since our last visit reviewed. Allergies and medications reviewed and updated. Current Outpatient Prescriptions on File Prior to Visit  Medication Sig  . lisinopril (PRINIVIL,ZESTRIL) 5 MG tablet Take 1 tablet (5 mg total) by mouth daily.  . metoprolol tartrate (LOPRESSOR) 25 MG tablet Take 1 tablet (25 mg total) by mouth 2 (two) times daily.  Marland Kitchen omeprazole (PRILOSEC) 20 MG capsule Take 1 capsule (20 mg total) by mouth daily.   No current facility-administered medications on file prior to visit.    Review of Systems  Constitutional: Negative for fever, chills, activity change, appetite change, fatigue and unexpected weight change.  HENT: Negative  for hearing loss.   Eyes: Negative for visual disturbance.  Respiratory: Negative for cough, chest tightness, shortness of breath and wheezing.   Cardiovascular: Positive for palpitations (occaisonal). Negative for chest pain and leg swelling.  Gastrointestinal: Negative for nausea, vomiting, abdominal pain, diarrhea, constipation, blood in stool and abdominal distention.  Genitourinary: Negative for hematuria and difficulty urinating.  Musculoskeletal: Negative for myalgias, arthralgias and neck pain.  Skin: Negative for rash.  Neurological: Negative for dizziness, seizures, syncope and headaches.  Hematological: Negative for adenopathy. Does not bruise/bleed easily.  Psychiatric/Behavioral: Negative for dysphoric mood. The patient is not nervous/anxious.    Per HPI unless specifically indicated above     Objective:    BP 124/82 mmHg  Pulse 80  Temp(Src) 98 F (36.7 C) (Oral)  Ht 5\' 5"  (1.651 m)  Wt 181 lb 4 oz (82.214 kg)  BMI 30.16 kg/m2  LMP 01/24/2015  Wt Readings from Last 3 Encounters:  02/01/15 181 lb 4 oz (82.214 kg)  10/27/14 181 lb 8 oz (82.328 kg)  10/03/14 184 lb 8 oz (83.689 kg)    Physical Exam  Constitutional: She is oriented to person, place, and time. She appears well-developed and well-nourished. No distress.  HENT:  Head: Normocephalic and atraumatic.  Right Ear: Hearing, tympanic membrane, external ear and ear canal normal.  Left Ear: Hearing, tympanic membrane, external ear and ear canal normal.  Nose: Nose normal.  Mouth/Throat: Uvula is midline, oropharynx is clear and moist and mucous membranes are normal. No oropharyngeal exudate,  posterior oropharyngeal edema or posterior oropharyngeal erythema.  Eyes: Conjunctivae and EOM are normal. Pupils are equal, round, and reactive to light. No scleral icterus.  Neck: Normal range of motion. Neck supple. No thyromegaly present.  Cardiovascular: Normal rate, regular rhythm, normal heart sounds and intact distal  pulses.   No murmur heard. Pulses:      Radial pulses are 2+ on the right side, and 2+ on the left side.  Pulmonary/Chest: Effort normal and breath sounds normal. No respiratory distress. She has no wheezes. She has no rales.  Abdominal: Soft. Bowel sounds are normal. She exhibits no distension and no mass. There is no tenderness. There is no rebound and no guarding.  Musculoskeletal: Normal range of motion. She exhibits no edema.  Bunionettes present bilaterally, no pain to palpation  Lymphadenopathy:    She has no cervical adenopathy.  Neurological: She is alert and oriented to person, place, and time.  CN grossly intact, station and gait intact  Skin: Skin is warm and dry. No rash noted.  Psychiatric: She has a normal mood and affect. Her behavior is normal. Judgment and thought content normal.  Nursing note and vitals reviewed.  Results for orders placed or performed in visit on 01/30/15  Lipid panel  Result Value Ref Range   Cholesterol 138 0 - 200 mg/dL   Triglycerides 54.0 0.0 - 149.0 mg/dL   HDL 44.20 >39.00 mg/dL   VLDL 10.8 0.0 - 40.0 mg/dL   LDL Cholesterol 83 0 - 99 mg/dL   Total CHOL/HDL Ratio 3    NonHDL 11.91   Basic metabolic panel  Result Value Ref Range   Sodium 136 135 - 145 mEq/L   Potassium 3.6 3.5 - 5.1 mEq/L   Chloride 103 96 - 112 mEq/L   CO2 26 19 - 32 mEq/L   Glucose, Bld 81 70 - 99 mg/dL   BUN 9 6 - 23 mg/dL   Creatinine, Ser 0.80 0.40 - 1.20 mg/dL   Calcium 8.9 8.4 - 10.5 mg/dL   GFR 85.82 >60.00 mL/min      Assessment & Plan:   Problem List Items Addressed This Visit    Palpitations    Persistent issue - initial EKG 08/2014 with ?ventricular bigeminy. Echo overall normal, mild dilated LV but normal EF.  Palpitations may have recently been exacerbated after restarting workout routine last week.  Given persistent palpitations, will refer to cards for further evaluation ?holter.  Would consider titrating metoprolol and d/c lisinopril.        Relevant Orders   Ambulatory referral to Cardiology   Overweight    Body mass index is 30.16 kg/(m^2).  Restarted exercise routine.      Hypertension, essential    Chronic, stable. Continue regimen.      Healthcare maintenance - Primary    Preventative protocols reviewed and updated unless pt declined. Discussed healthy diet and lifestyle.       Bunionette    Bilateral - bothersome, limit ability to use heels. Requests podiatry referral to discuss options for treatment.      Relevant Orders   Ambulatory referral to Podiatry       Follow up plan: Return in about 1 year (around 02/01/2016), or as needed, for annual exam, prior fasting for blood work.

## 2015-02-01 NOTE — Patient Instructions (Signed)
For persistent palpitations - we will refer you to cardiology for evaluation. If normal evaluation, we will discuss increasing metoprolol dose. For bunionettes - referral to podiatrist to discuss options. Good to see you today, call us with quesitons.

## 2015-02-01 NOTE — Assessment & Plan Note (Signed)
Body mass index is 30.16 kg/(m^2).  Restarted exercise routine.

## 2015-02-01 NOTE — Assessment & Plan Note (Signed)
Chronic, stable. Continue regimen. 

## 2015-02-01 NOTE — Assessment & Plan Note (Signed)
Preventative protocols reviewed and updated unless pt declined. Discussed healthy diet and lifestyle.  

## 2015-02-01 NOTE — Progress Notes (Signed)
Pre visit review using our clinic review tool, if applicable. No additional management support is needed unless otherwise documented below in the visit note. 

## 2015-02-01 NOTE — Assessment & Plan Note (Signed)
Bilateral - bothersome, limit ability to use heels. Requests podiatry referral to discuss options for treatment.

## 2015-02-06 ENCOUNTER — Ambulatory Visit (INDEPENDENT_AMBULATORY_CARE_PROVIDER_SITE_OTHER): Payer: BC Managed Care – PPO | Admitting: Podiatry

## 2015-02-06 ENCOUNTER — Encounter: Payer: Self-pay | Admitting: Podiatry

## 2015-02-06 ENCOUNTER — Ambulatory Visit (INDEPENDENT_AMBULATORY_CARE_PROVIDER_SITE_OTHER): Payer: BC Managed Care – PPO

## 2015-02-06 VITALS — BP 133/89 | HR 88 | Resp 16 | Ht 65.0 in | Wt 181.0 lb

## 2015-02-06 DIAGNOSIS — M2010 Hallux valgus (acquired), unspecified foot: Secondary | ICD-10-CM

## 2015-02-06 DIAGNOSIS — M205X9 Other deformities of toe(s) (acquired), unspecified foot: Secondary | ICD-10-CM | POA: Diagnosis not present

## 2015-02-06 DIAGNOSIS — M21629 Bunionette of unspecified foot: Secondary | ICD-10-CM

## 2015-02-06 NOTE — Progress Notes (Signed)
   Subjective:    Patient ID: Jasmine Dixon, female    DOB: 1976-01-22, 39 y.o.   MRN: [id number]  HPI Bilateral foot pain along the sides of my feet , right greater than left. Dress shoes seem to make it worse. The side near the pinky toe . It has been going on for at least 6 months    Review of Systems     Objective:   Physical Exam: I have reviewed her past medical history medications allergies surgery social history and review of systems. Pulses are strongly palpable bilateral. Neurologic sensorium is intact bilateral. Deep tendon reflexes are intact bilateral and muscle strength +5 over 5 dorsiflexion plantar flexors and inverters and everters all intrinsic musculature appears to be intact. Orthopedic evaluation demonstrates all joints distal to the ankle level range of motion without crepitation. She has rectus foot type bilateral with mild hallux valgus deformities bilaterally. However the majority of her symptoms are associated with fifth metatarsophalangeal joints where an overlying neuritis is noted. She also has tailor bunion deformities which are confirmed by radiographs. No other osseous abnormalities other than the after mentioned hallux valgus deformities. Cutaneous evaluation of his wrist supple well-hydrated Q is no erythema edema cellulitis drainage or odor.        Assessment & Plan:  Assessment: Tailor's bunion deformities bilateral.  Plan: Discussed etiology pathology conservative versus surgical therapies. At this point we consented her for surgical fifth metatarsal osteotomies with screw fixation. I answered all of her questions regarding this procedure to the best of my ability in layman's terms. She understood this was amenable to it and signed altered pages of the consent form. We did discuss the possible postop complications which may include but are not limited to postop pain leading swelling infection recurrence and need for further surgery. We also discussed the  possible DVT and PE risk. I will follow-up with her in the near future for surgery.

## 2015-02-07 ENCOUNTER — Encounter: Payer: Self-pay | Admitting: Podiatry

## 2015-02-08 ENCOUNTER — Telehealth: Payer: Self-pay | Admitting: *Deleted

## 2015-02-08 NOTE — Telephone Encounter (Signed)
PA required for Omeprazole. Completed through covermymeds. Will await determination.

## 2015-02-21 NOTE — Telephone Encounter (Signed)
PA denied. Will need substitute.

## 2015-03-01 ENCOUNTER — Encounter: Payer: Self-pay | Admitting: Family Medicine

## 2015-03-01 ENCOUNTER — Encounter: Payer: Self-pay | Admitting: Cardiology

## 2015-03-01 ENCOUNTER — Ambulatory Visit (INDEPENDENT_AMBULATORY_CARE_PROVIDER_SITE_OTHER): Payer: BC Managed Care – PPO | Admitting: Cardiology

## 2015-03-01 VITALS — BP 130/90 | HR 74 | Ht 65.0 in | Wt 176.2 lb

## 2015-03-01 DIAGNOSIS — I493 Ventricular premature depolarization: Secondary | ICD-10-CM

## 2015-03-01 DIAGNOSIS — I10 Essential (primary) hypertension: Secondary | ICD-10-CM | POA: Diagnosis not present

## 2015-03-01 DIAGNOSIS — R002 Palpitations: Secondary | ICD-10-CM

## 2015-03-01 MED ORDER — NORGESTIMATE-ETH ESTRADIOL 0.25-35 MG-MCG PO TABS: 1.0000 | ORAL_TABLET | Freq: Every day | ORAL | Status: DC

## 2015-03-01 NOTE — Patient Instructions (Signed)
Medication Instructions:  Your physician recommends that you continue on your current medications as directed. Please refer to the Current Medication list given to you today.  Labwork: none  Testing/Procedures: Your physician has requested that you have an exercise tolerance test.  Do not take your metoprolol the night before or the day of the stress test. You may bring it with you to take after your test.   Follow-Up: Your physician recommends that you schedule a follow-up appointment with Dr. Ellyn Hack after stress test    Any Other Special Instructions Will Be Listed Below (If Applicable).  Exercise Stress Electrocardiogram  An exercise stress electrocardiogram is a test that is done to evaluate the blood supply to your heart. This test may also be called exercise stress electrocardiography. The test is done while you are walking on a treadmill. The goal of this test is to raise your heart rate. This test is done to find areas of poor blood flow to the heart by determining the extent of coronary artery disease (CAD).    CAD is defined as narrowing in one or more heart (coronary) arteries of more than 70%. If you have an abnormal test result, this may mean that you are not getting adequate blood flow to your heart during exercise. Additional testing may be needed to understand why your test was abnormal.  LET [facility] CARE PROVIDER KNOW ABOUT:  Any allergies you have.  All medicines you are taking, including vitamins, herbs, eye drops, creams, and over-the-counter medicines.  Previous problems you or members of your family have had with the use of anesthetics.  Any blood disorders you have.  Previous surgeries you have had.  Medical conditions you have.  Possibility of pregnancy, if this applies. RISKS AND COMPLICATIONS  Generally, this is a safe procedure. However, as with any procedure, complications can occur. Possible complications can include:  Pain or pressure in the following  areas:  Chest.  Jaw or neck.  Between your shoulder blades.  Radiating down your left arm. Dizziness or light-headedness.  Shortness of breath.  Increased or irregular heartbeats.  Nausea or vomiting.  Heart attack (rare). BEFORE THE PROCEDURE  Avoid all forms of caffeine 24 hours before your test or as directed by your health care provider. This includes coffee, tea (even decaffeinated tea), caffeinated sodas, chocolate, cocoa, and certain pain medicines.  Follow your health care provider's instructions regarding eating and drinking before the test.  Take your medicines as directed at regular times with water unless instructed otherwise. Exceptions may include:  If you have diabetes, ask how you are to take your insulin or pills. It is common to adjust insulin dosing the morning of the test.  If you are taking beta-blocker medicines, it is important to talk to your health care provider about these medicines well before the date of your test. Taking beta-blocker medicines may interfere with the test. In some cases, these medicines need to be changed or stopped 24 hours or more before the test.  If you wear a nitroglycerin patch, it may need to be removed prior to the test. Ask your health care provider if the patch should be removed before the test. If you use an inhaler for any breathing condition, bring it with you to the test.  If you are an outpatient, bring a snack so you can eat right after the stress phase of the test.  Do not smoke for 4 hours prior to the test or as directed by your health  care provider.  Do not apply lotions, powders, creams, or oils on your chest prior to the test.  Wear loose-fitting clothes and comfortable shoes for the test. This test involves walking on a treadmill. PROCEDURE  Multiple patches (electrodes) will be put on your chest. If needed, small areas of your chest may have to be shaved to get better contact with the electrodes. Once the electrodes are  attached to your body, multiple wires will be attached to the electrodes and your heart rate will be monitored.  Your heart will be monitored both at rest and while exercising.  You will walk on a treadmill. The treadmill will be started at a slow pace. The treadmill speed and incline will gradually be increased to raise your heart rate. AFTER THE PROCEDURE  Your heart rate and blood pressure will be monitored after the test.  You may return to your normal schedule including diet, activities, and medicines, unless your health care provider tells you otherwise.

## 2015-03-01 NOTE — Progress Notes (Signed)
PATIENT: Jasmine Dixon MRN: [id number] DOB: [birth year] PCP: Ria Bush, MD  Clinic Note: Chief Complaint  Patient presents with  . toher    Per PCP due to palpitations. Meds reviewed verbally with pt.    HPI: Jasmine Dixon is a 39 y.o. female with a PMH below who presents today for initial cardiac evaluation for palpitations/frequent PVCs.  She was initially evaluated by her PCP back in December with a similar complaint of palpitations. Apparently she been very stressed out at school. She been noticing intermittent palpitations but was feeling very poorly when she went to the doctor's office that day. Noted to have high blood pressures and dizziness. So noted significant weight gain at that time.  Her EKG at that time showed ventricular bigeminy - apparently RVOT pattern of PVCs.  She had an echocardiogram performed that was relatively normal.  She was started on metoprolol and since then has had minimal residual palpitations. She's not had any further episodes of lightheadedness and dizziness.  She is currently in a weight loss program with dietary modification, and is now back to exercising.  She denies any fatigue or exercise intolerance related to the beta blocker use.   The remainder of Cardiovascular ROS of systems is as follows: : no chest pain or dyspnea on exertion positive for - Rare palpitations now negative for - edema, loss of consciousness, murmur, orthopnea, paroxysmal nocturnal dyspnea, rapid heart rate, shortness of breath or Syncope/near syncope, TIA/amaurosis fugax :   Past Medical History  Diagnosis Date  . Elevated blood pressure reading without diagnosis of hypertension   . Overweight(278.02)   . Preterm labor   . Lichen planus 9937    back, forearms, and legs  . Hypertension     Prior Cardiac Evaluation and Past Surgical History: Past Surgical History  Procedure Laterality Date  . US echocardiography  10/2014    WNL, mildly dilated LV, EFF  55-60%    No Known Allergies  Current Outpatient Prescriptions  Medication Sig Dispense Refill  . lisinopril (PRINIVIL,ZESTRIL) 5 MG tablet Take 1 tablet (5 mg total) by mouth daily. 30 tablet 11  . metoprolol tartrate (LOPRESSOR) 25 MG tablet Take 1 tablet (25 mg total) by mouth 2 (two) times daily. 60 tablet 3  . norgestimate-ethinyl estradiol (SPRINTEC 28) 0.25-35 MG-MCG tablet Take 1 tablet by mouth daily. 1 Package 11  . omeprazole (PRILOSEC) 40 MG capsule Take 1 capsule (40 mg total) by mouth daily. 30 capsule 3   No current facility-administered medications for this visit.    History   Social History Narrative   3-4 cups coffee/day   Lives with husband and 1 healthy daughter at home (21 week preemie,2007), lost twin son,ab other twin pregnacy   Occ: Therapist, sports at [employer], finishing NP school   Edu: finishing NP school   Activity: gym, has Physiological scientist   Diet: good water, fruits/vegetables daily, weight watchers    family history includes Breast cancer (age of onset: 47) in her paternal aunt and paternal grandmother; CAD in her maternal grandmother; Diabetes in her father; Heart attack (age of onset: 75) in her maternal grandmother; Hypertension in her father and paternal grandmother.  ROS: A comprehensive Review of Systems - was performed Review of Systems  Constitutional: Negative for malaise/fatigue.  Respiratory: Negative for cough.   Gastrointestinal: Negative for blood in stool and melena.  Genitourinary: Negative for hematuria.  Neurological: Negative for dizziness.  Psychiatric/Behavioral: Negative for depression.  All other systems reviewed and are negative.  PHYSICAL EXAM BP 130/90 mmHg  Pulse 74  Ht 5\' 5"  (1.651 m)  Wt 79.946 kg (176 lb 4 oz)  BMI 29.33 kg/m2  LMP 01/24/2015 General appearance: alert, cooperative, appears stated age, no distress and Very pleasant mood and affect. Healthy-appearing. Neck: no adenopathy, no carotid bruit, no JVD, supple,  symmetrical, trachea midline and thyroid not enlarged, symmetric, no tenderness/mass/nodules Lungs: clear to auscultation bilaterally and normal percussion bilaterally Heart: regular rate and rhythm, S1, S2 normal, no murmur, click, rub or gallop and normal apical impulse Abdomen: soft, non-tender; bowel sounds normal; no masses,  no organomegaly Extremities: extremities normal, atraumatic, no cyanosis or edema Pulses: 2+ and symmetric Skin: Skin color, texture, turgor normal. No rashes or lesions Neurologic: Alert and oriented X 3, normal strength and tone. Normal symmetric reflexes. Normal coordination and gait   Adult ECG Report  Rate 74  Rhythm: normal sinus rhythm and Low voltage but otherwise normal EKG with normal axis, intervals and durations.   Narrative Interpretation Normal EKG  Recent Labs: reviewed  Lab Results  Component Value Date   CHOL 138 01/30/2015   HDL 44.20 01/30/2015   LDLCALC 83 01/30/2015   TRIG 54.0 01/30/2015   CHOLHDL 3 01/30/2015   ASSESSMENT / PLAN: Very pleasant young woman with history of PVCs and bigeminy pattern now well controlled with beta blocker. She had an evaluation with an echocardiogram that was essentially normal.   Problem List Items Addressed This Visit    Hypertension, essential (Chronic)    Relatively well-controlled on current medications. She still has elevated diastolic pressure today. Otherwise been well controlled. No change.      Palpitations    Most consistent with PVCs. She may have PACs but unlikely at 2 different abnormalities.  See above      Symptomatic PVCs - Primary (Chronic)    History of frequent PVCs and bigeminal pattern.  I reviewed the concept of left bundle-branch block pattern of PVCs being possibly RVOT mediated.  I would like to determine if these PVCs are exacerbated by activity. She has normal echo already.  PLAN:  GXT: She will hold her beta blocker the night before and morning of the test. The plan  will be to determine if there are PVCs, the repeat action/response of PVCs to exercise. She indicates that they do not get worse with exertion.  No since her PVCs are well controlled on the current dose of beta blocker, will continue current dose of beta blocker. However if they do become more frequent again, I would like to have her wear monitor in order to determine the extent of PVCs. More frequent PVCs in the course the day with this pattern could potentially LV dysfunction.      Relevant Orders   EKG 12-Lead (Completed)   Exercise Tolerance Test      No orders of the defined types were placed in this encounter.    Followup: following GXT    Doc Mandala W. Ellyn Hack, M.D., M.S. [facility]

## 2015-03-02 MED ORDER — OMEPRAZOLE 40 MG PO CPDR: 40.0000 mg | DELAYED_RELEASE_CAPSULE | Freq: Every day | ORAL | Status: DC

## 2015-03-02 NOTE — Telephone Encounter (Signed)
Have sent in omeprazole 40mg  daily to try - but may have patient take 20mg  omeprazole/prilosec OTC if not approved.

## 2015-03-02 NOTE — Addendum Note (Signed)
Addended by: Ria Bush on: 03/02/2015 09:32 AM   Modules accepted: Orders

## 2015-03-03 ENCOUNTER — Encounter: Payer: Self-pay | Admitting: Cardiology

## 2015-03-03 ENCOUNTER — Encounter: Payer: Self-pay | Admitting: *Deleted

## 2015-03-03 DIAGNOSIS — I493 Ventricular premature depolarization: Secondary | ICD-10-CM | POA: Insufficient documentation

## 2015-03-03 NOTE — Assessment & Plan Note (Signed)
Most consistent with PVCs. She may have PACs but unlikely at 2 different abnormalities.  See above

## 2015-03-03 NOTE — Assessment & Plan Note (Signed)
Relatively well-controlled on current medications. She still has elevated diastolic pressure today. Otherwise been well controlled. No change.

## 2015-03-03 NOTE — Assessment & Plan Note (Signed)
History of frequent PVCs and bigeminal pattern.  I reviewed the concept of left bundle-branch block pattern of PVCs being possibly RVOT mediated.  I would like to determine if these PVCs are exacerbated by activity. She has normal echo already.  PLAN:  GXT: She will hold her beta blocker the night before and morning of the test. The plan will be to determine if there are PVCs, the repeat action/response of PVCs to exercise. She indicates that they do not get worse with exertion.  No since her PVCs are well controlled on the current dose of beta blocker, will continue current dose of beta blocker. However if they do become more frequent again, I would like to have her wear monitor in order to determine the extent of PVCs. More frequent PVCs in the course the day with this pattern could potentially LV dysfunction.

## 2015-03-10 ENCOUNTER — Other Ambulatory Visit: Payer: Self-pay | Admitting: Family Medicine

## 2015-03-20 ENCOUNTER — Ambulatory Visit (INDEPENDENT_AMBULATORY_CARE_PROVIDER_SITE_OTHER): Payer: BC Managed Care – PPO

## 2015-03-20 DIAGNOSIS — I493 Ventricular premature depolarization: Secondary | ICD-10-CM | POA: Diagnosis not present

## 2015-03-20 LAB — EXERCISE TOLERANCE TEST
Estimated workload: 11.6 METS
Exercise duration (min): 10 min
Exercise duration (sec): 0 s
Peak HR: 160 {beats}/min
Percent HR: 87 %
Rest HR: 85 {beats}/min

## 2015-04-26 ENCOUNTER — Ambulatory Visit: Payer: BC Managed Care – PPO | Admitting: Cardiology

## 2015-10-03 ENCOUNTER — Other Ambulatory Visit: Payer: Self-pay | Admitting: Family Medicine

## 2015-11-13 ENCOUNTER — Other Ambulatory Visit: Payer: Self-pay | Admitting: Family Medicine

## 2016-01-27 ENCOUNTER — Other Ambulatory Visit: Payer: Self-pay | Admitting: Family Medicine

## 2016-01-27 DIAGNOSIS — I10 Essential (primary) hypertension: Secondary | ICD-10-CM

## 2016-01-27 DIAGNOSIS — R002 Palpitations: Secondary | ICD-10-CM

## 2016-01-29 ENCOUNTER — Other Ambulatory Visit: Payer: BC Managed Care – PPO

## 2016-02-02 ENCOUNTER — Encounter: Payer: BC Managed Care – PPO | Admitting: Family Medicine

## 2016-03-18 ENCOUNTER — Other Ambulatory Visit: Payer: Self-pay | Admitting: Family Medicine

## 2016-03-20 ENCOUNTER — Other Ambulatory Visit: Payer: Self-pay | Admitting: Family Medicine

## 2016-04-01 IMAGING — US US SOFT TISSUE HEAD/NECK
1 series · 14 of 25 positions shown · non-contrast
Comparison: None.

CLINICAL DATA: Thyroid enlargement

EXAM:
THYROID ULTRASOUND
TECHNIQUE: Ultrasound examination of the thyroid gland and adjacent soft
tissues was performed.

[Series 1: us soft tissue head/neck · 0.08mm/px · 14 of 38 slices shown]
[im 1/38]
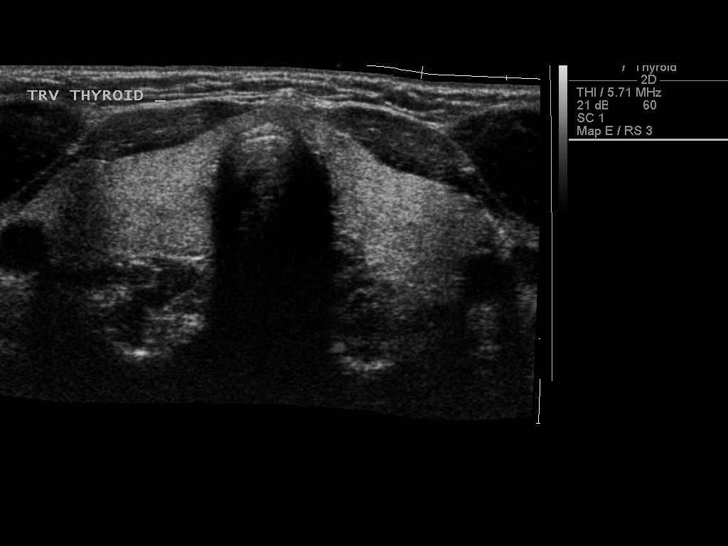
[im 4/38]
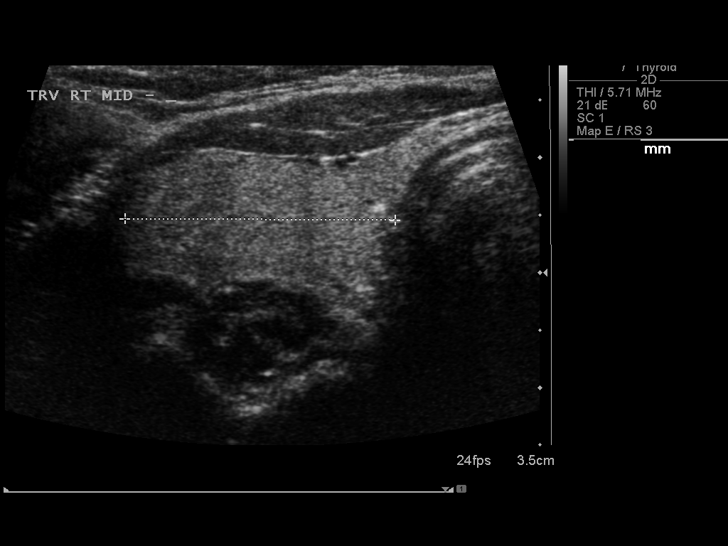
[im 7/38]
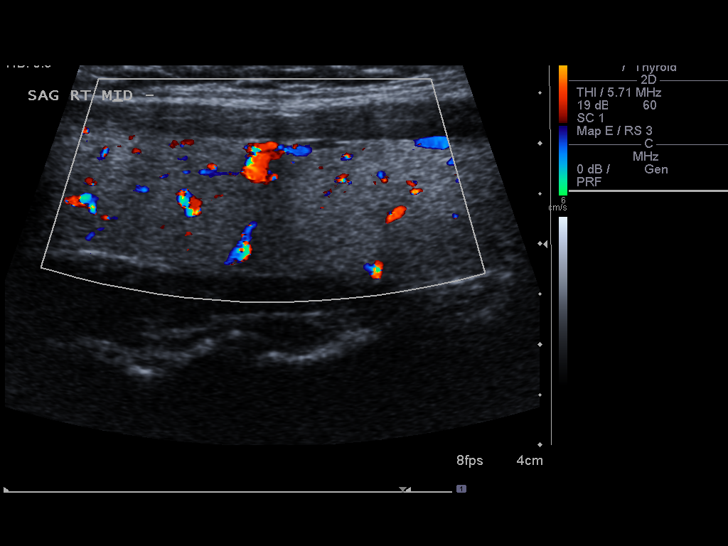
[im 10/38]
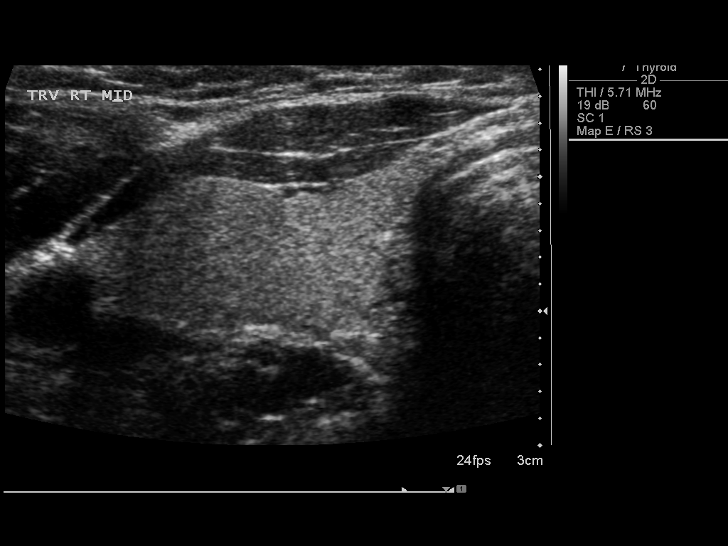
[im 13/38]
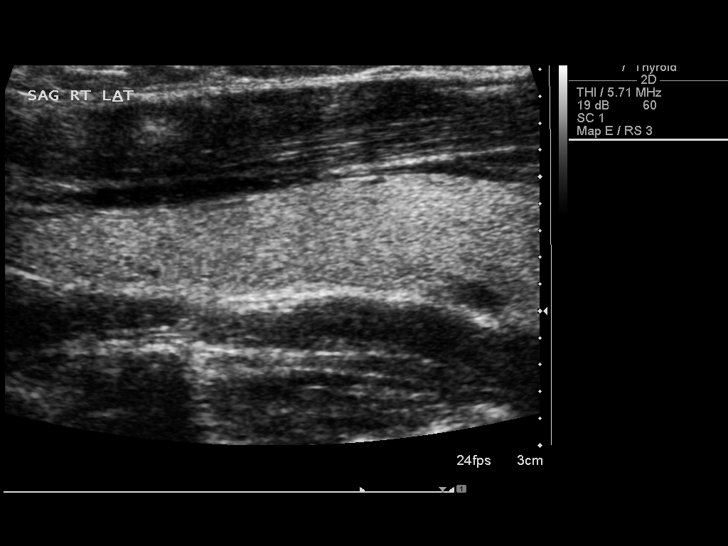
[im 14/38]
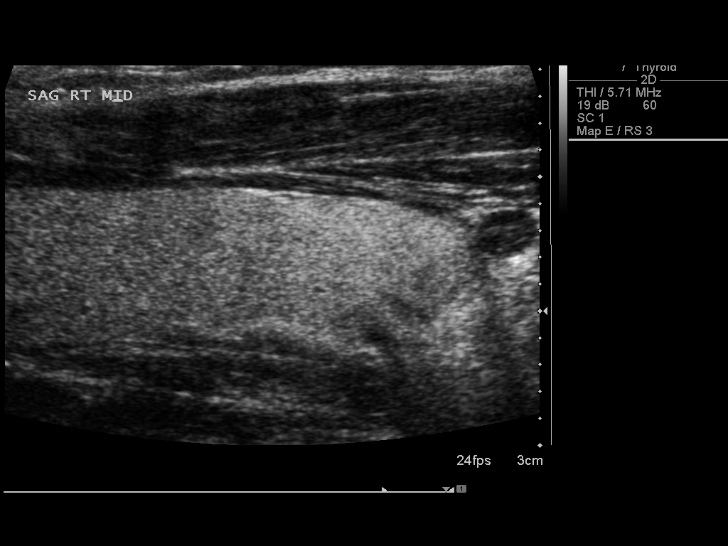
[im 17/38]
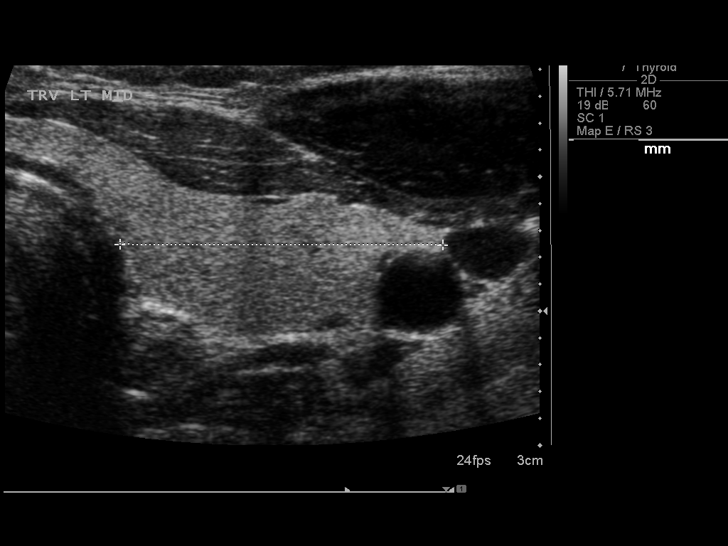
[im 21/38]
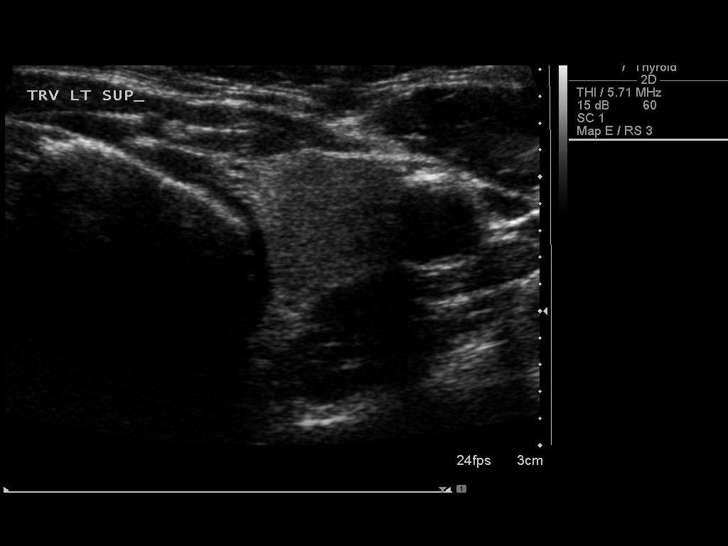
[im 24/38]
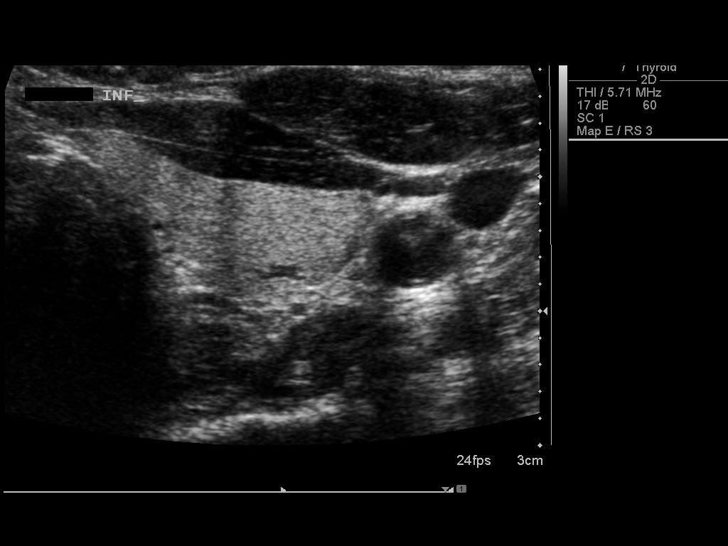
[im 25/38]
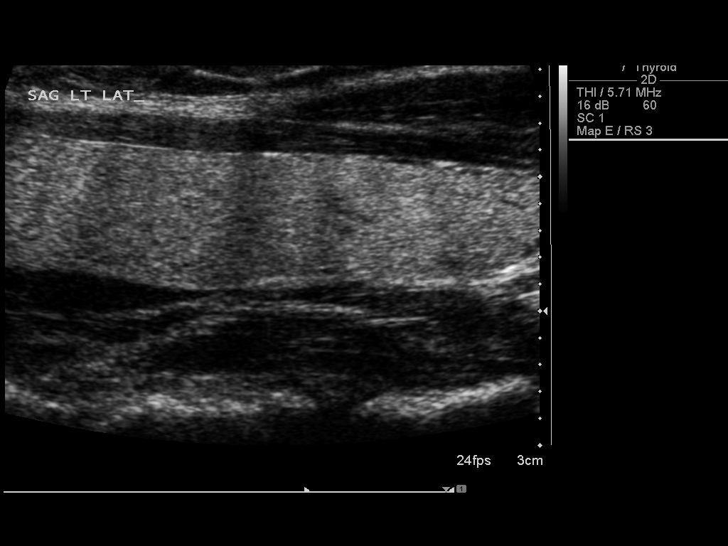
[im 28/38]
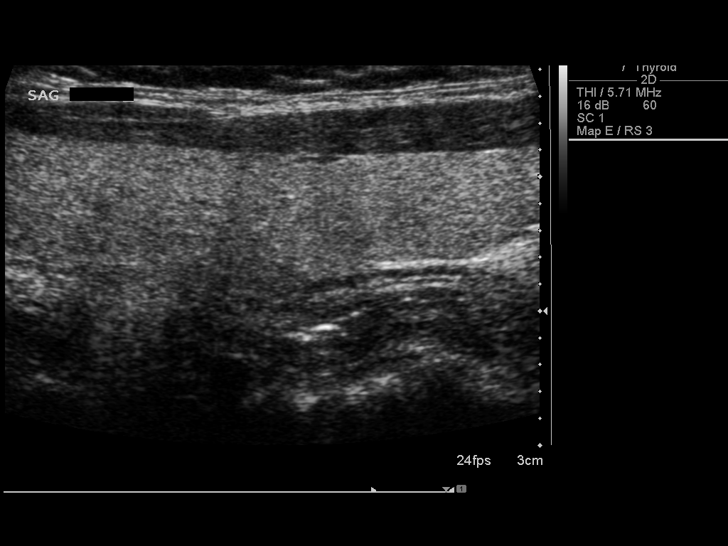
[im 31/38]
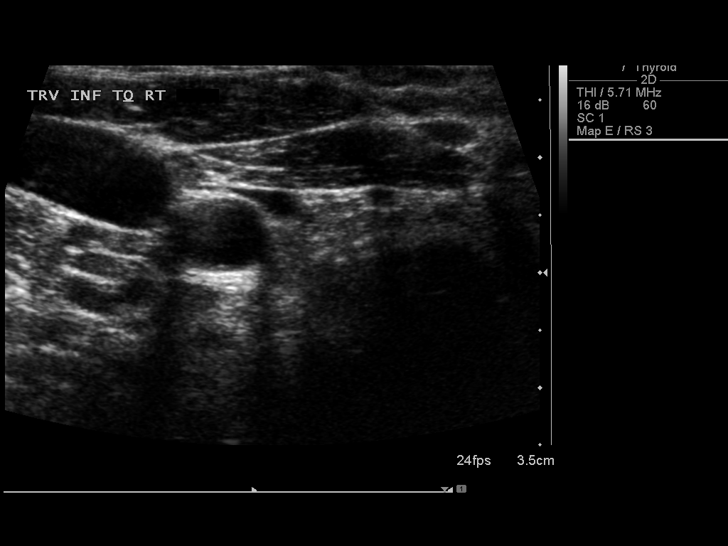
[im 34/38]
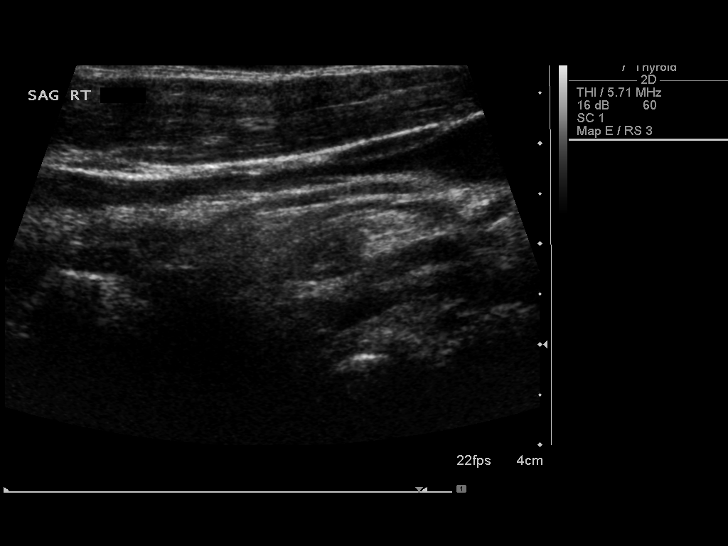
[im 38/38]
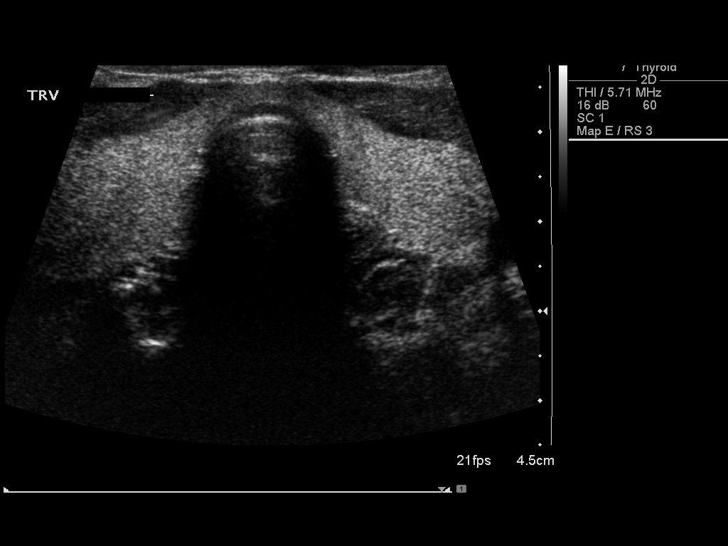

[14 of 25 positions shown; findings below may reference images not displayed]

FINDINGS: Right thyroid lobe

Measurements: 6.6 x 1.3 x 2.4 cm.  No nodules visualized.

Left thyroid lobe

Measurements: 6.0 x 0.9 x 2.4 cm.  No nodules visualized.

Isthmus

Thickness: 4 mm.  No nodules visualized.

Lymphadenopathy

None visualized.
IMPRESSION: Within normal limits.  Homogeneous normal size gland.  No nodule.

## 2016-04-10 ENCOUNTER — Encounter: Payer: Self-pay | Admitting: Family Medicine

## 2016-04-10 ENCOUNTER — Ambulatory Visit (INDEPENDENT_AMBULATORY_CARE_PROVIDER_SITE_OTHER): Payer: BLUE CROSS/BLUE SHIELD | Admitting: Family Medicine

## 2016-04-10 VITALS — BP 112/82 | HR 85 | Temp 98.4°F | Ht 65.25 in | Wt 172.0 lb

## 2016-04-10 DIAGNOSIS — E663 Overweight: Secondary | ICD-10-CM

## 2016-04-10 DIAGNOSIS — Z0000 Encounter for general adult medical examination without abnormal findings: Secondary | ICD-10-CM | POA: Diagnosis not present

## 2016-04-10 DIAGNOSIS — I493 Ventricular premature depolarization: Secondary | ICD-10-CM

## 2016-04-10 DIAGNOSIS — R002 Palpitations: Secondary | ICD-10-CM | POA: Diagnosis not present

## 2016-04-10 DIAGNOSIS — I10 Essential (primary) hypertension: Secondary | ICD-10-CM

## 2016-04-10 DIAGNOSIS — K219 Gastro-esophageal reflux disease without esophagitis: Secondary | ICD-10-CM

## 2016-04-10 LAB — TSH: TSH: 1.1 u[IU]/mL (ref 0.35–4.50)

## 2016-04-10 LAB — BASIC METABOLIC PANEL
BUN: 12 mg/dL (ref 6–23)
CALCIUM: 9.3 mg/dL (ref 8.4–10.5)
CHLORIDE: 103 meq/L (ref 96–112)
CO2: 24 meq/L (ref 19–32)
Creatinine, Ser: 0.78 mg/dL (ref 0.40–1.20)
GFR: 87.79 mL/min (ref >60.00)
GLUCOSE: 85 mg/dL (ref 70–99)
POTASSIUM: 4.9 meq/L (ref 3.5–5.1)
SODIUM: 133 meq/L — AB (ref 135–145)

## 2016-04-10 LAB — MICROALBUMIN / CREATININE URINE RATIO
CREATININE, U: 202.4 mg/dL
MICROALB/CREAT RATIO: 0.7 mg/g (ref 0.0–30.0)
Microalb, Ur: 1.4 mg/dL (ref 0.0–1.9)

## 2016-04-10 MED ORDER — OMEPRAZOLE 40 MG PO CPDR: 40.0000 mg | DELAYED_RELEASE_CAPSULE | Freq: Every day | ORAL | Status: DC

## 2016-04-10 MED ORDER — LISINOPRIL 5 MG PO TABS: ORAL_TABLET | ORAL | Status: DC

## 2016-04-10 MED ORDER — METOPROLOL TARTRATE 25 MG PO TABS: 25.0000 mg | ORAL_TABLET | Freq: Two times a day (BID) | ORAL | Status: DC | PRN

## 2016-04-10 NOTE — Progress Notes (Signed)
BP 112/82 mmHg  Pulse 85  Temp(Src) 98.4 F (36.9 C)  Ht 5' 5.25" (1.657 m)  Wt 172 lb (78.019 kg)  BMI 28.42 kg/m2  SpO2 100%  LMP 03/26/2016   CC: CPE  Subjective:    Patient ID: Jasmine Dixon, female    DOB: [birth year], 40 y.o.   MRN: [id number]  HPI: Jasmine Dixon is a 40 y.o. female presenting on 04/10/2016 for Annual Exam and Medication Refill   H/o symptomatic PVCs now controlled on beta blocker. Work up included echo and exercise tolerance test.   GERD - taking omeprazole 40mg  daily - OTC 20mg  not as effective.   Completed NP school last year 2016 Initially worked with [employer].  Now working for [employer].   Preventative: Well woman at [facility] - Dr. Georgiann Cocker at [facility] physicians. h/o abnl paps, removed abnl cells s/p LEEP. Normal since, now sees Q3 years. Upcoming appt next week.  Flu shot yearly at work Tdap - 09/2012 Seat belt use discussed Avoids sun. No changing moles on skin.  LMP end of June 2017, regular cycles.   h/o preterm labor at 60 6 wks, lost son, twin daughter doing well.  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 Interim medical history since our last visit reviewed. Allergies and medications reviewed and updated. Current Outpatient Prescriptions on File Prior to Visit  Medication Sig  . norgestimate-ethinyl estradiol (SPRINTEC 28) 0.25-35 MG-MCG tablet Take 1 tablet by mouth daily. **MUST HAVE PHYSICAL FOR FURTHER REFILLS**   No current facility-administered medications on file prior to visit.    Review of Systems  Constitutional: Negative for fever, chills, activity  change, appetite change, fatigue and unexpected weight change.  HENT: Negative for hearing loss.   Eyes: Negative for visual disturbance.  Respiratory: Negative for cough, chest tightness, shortness of breath and wheezing.   Cardiovascular: Negative for chest pain, palpitations and leg swelling.  Gastrointestinal: Negative for nausea, vomiting, abdominal pain, diarrhea, constipation, blood in stool and abdominal distention.  Genitourinary: Negative for hematuria and difficulty urinating.  Musculoskeletal: Negative for myalgias, arthralgias and neck pain.  Skin: Negative for rash.  Neurological: Negative for dizziness, seizures, syncope and headaches.  Hematological: Negative for adenopathy. Does not bruise/bleed easily.  Psychiatric/Behavioral: Negative for dysphoric mood. The patient is not nervous/anxious.    Per HPI unless specifically indicated in ROS section     Objective:    BP 112/82 mmHg  Pulse 85  Temp(Src) 98.4 F (36.9 C)  Ht 5' 5.25" (1.657 m)  Wt 172 lb (78.019 kg)  BMI 28.42 kg/m2  SpO2 100%  LMP 03/26/2016  Wt Readings from Last 3 Encounters:  04/10/16 172 lb (78.019 kg)  03/01/15 176 lb 4 oz (79.946 kg)  02/06/15 181 lb (82.101 kg)    Physical Exam  Constitutional: She is oriented to person, place, and time. She appears well-developed and well-nourished. No distress.  HENT:  Head: Normocephalic and atraumatic.  Right Ear: Hearing, tympanic membrane, external ear and ear canal normal.  Left Ear: Hearing, tympanic membrane, external ear and ear canal normal.  Nose: Nose normal.  Mouth/Throat: Uvula is midline, oropharynx is clear and moist and mucous membranes are normal.  No oropharyngeal exudate, posterior oropharyngeal edema or posterior oropharyngeal erythema.  Eyes: Conjunctivae and EOM are normal. Pupils are equal, round, and reactive to light. No scleral icterus.  Neck: Normal range of motion. Neck supple. No thyromegaly present.  Cardiovascular: Normal  rate, regular rhythm, normal heart sounds and intact distal pulses.   No murmur heard. Pulses:      Radial pulses are 2+ on the right side, and 2+ on the left side.  Pulmonary/Chest: Effort normal and breath sounds normal. No respiratory distress. She has no wheezes. She has no rales.  Abdominal: Soft. Bowel sounds are normal. She exhibits no distension and no mass. There is no tenderness. There is no rebound and no guarding.  Musculoskeletal: Normal range of motion. She exhibits no edema.  Lymphadenopathy:    She has no cervical adenopathy.  Neurological: She is alert and oriented to person, place, and time.  CN grossly intact, station and gait intact  Skin: Skin is warm and dry. No rash noted.  Psychiatric: She has a normal mood and affect. Her behavior is normal. Judgment and thought content normal.  Nursing note and vitals reviewed.  Results for orders placed or performed in visit on 03/20/15  Exercise Tolerance Test  Result Value Ref Range   Rest HR 85 bpm   Rest BP 139/76 mmHg   Exercise duration (min) 10 min   Exercise duration (sec) 0 sec   Estimated workload 11.6 METS   Peak HR 160 bpm   Peak BP 139/76 mmHg   MPHR  bpm   Percent HR 87 %   RPE        Assessment & Plan:   Problem List Items Addressed This Visit    Hypertension, essential (Chronic)    Chronic, stable. Continue current regimen.       Relevant Medications   lisinopril (PRINIVIL,ZESTRIL) 5 MG tablet   metoprolol tartrate (LOPRESSOR) 25 MG tablet   Symptomatic PVCs (Chronic)    Off beta blocker over last 1.5 months. Not noticing any significant palpitations. Discussed PRN use from here on out.       Relevant Medications   lisinopril (PRINIVIL,ZESTRIL) 5 MG tablet   metoprolol tartrate (LOPRESSOR) 25 MG tablet   Overweight    Discussed healthy diet and lifestyle changes to continue sustainable weight loss.      Healthcare maintenance - Primary    Preventative protocols reviewed and updated unless  pt declined. Discussed healthy diet and lifestyle.       Palpitations   GERD (gastroesophageal reflux disease)    Breakthrough sxs on OTC dosing. Will refill 40mg  omeprazole and see if new insurance will cover.      Relevant Medications   omeprazole (PRILOSEC) 40 MG capsule       Follow up plan: Return in about 1 year (around 04/10/2017), or as needed, for annual exam, prior fasting for blood work.  Ria Bush, MD

## 2016-04-10 NOTE — Patient Instructions (Signed)
You are doing well today  Continue lisinopril and omeprazole.  Metoprolol 37m will be twice daily as needed for palplitations Labs today Return as needed or in 1 year for next physical.  Health Maintenance, Female Adopting a healthy lifestyle and getting preventive care can go a long way to promote health and wellness. Talk with your health care provider about what schedule of regular examinations is right for you. This is a good chance for you to check in with your provider about disease prevention and staying healthy. In between checkups, there are plenty of things you can do on your own. Experts have done a lot of research about which lifestyle changes and preventive measures are most likely to keep you healthy. Ask your health care provider for more information. WEIGHT AND DIET  Eat a healthy diet  Be sure to include plenty of vegetables, fruits, low-fat dairy products, and lean protein.  Do not eat a lot of foods high in solid fats, added sugars, or salt.  Get regular exercise. This is one of the most important things you can do for your health.  Most adults should exercise for at least 150 minutes each week. The exercise should increase your heart rate and make you sweat (moderate-intensity exercise).  Most adults should also do strengthening exercises at least twice a week. This is in addition to the moderate-intensity exercise.  Maintain a healthy weight  Body mass index (BMI) is a measurement that can be used to identify possible weight problems. It estimates body fat based on height and weight. Your health care provider can help determine your BMI and help you achieve or maintain a healthy weight.  For females 38years of age and older:   A BMI below 18.5 is considered underweight.  A BMI of 18.5 to 24.9 is normal.  A BMI of 25 to 29.9 is considered overweight.  A BMI of 30 and above is considered obese.  Watch levels of cholesterol and blood lipids  You should start  having your blood tested for lipids and cholesterol at 38years of age, then have this test every 5 years.  You may need to have your cholesterol levels checked more often if:  Your lipid or cholesterol levels are high.  You are older than 38years of age.  You are at high risk for heart disease.  CANCER SCREENING   Lung Cancer  Lung cancer screening is recommended for adults 38years old who are at high risk for lung cancer because of a history of smoking.  A yearly low-dose CT scan of the lungs is recommended for people who:  Currently smoke.  Have quit within the past 15 years.  Have at least a 30-pack-year history of smoking. A pack year is smoking an average of one pack of cigarettes a day for 1 year.  Yearly screening should continue until it has been 15 years since you quit.  Yearly screening should stop if you develop a health problem that would prevent you from having lung cancer treatment.  Breast Cancer  Practice breast self-awareness. This means understanding how your breasts normally appear and feel.  It also means doing regular breast self-exams. Let your health care provider know about any changes, no matter how small.  If you are in your 20s or 30s, you should have a clinical breast exam (CBE) by a health care provider every 1-3 years as part of a regular health exam.  If you are 38or older, have a  CBE every year. Also consider having a breast X-ray (mammogram) every year.  If you have a family history of breast cancer, talk to your health care provider about genetic screening.  If you are at high risk for breast cancer, talk to your health care provider about having an MRI and a mammogram every year.  Breast cancer gene (BRCA) assessment is recommended for women who have family members with BRCA-related cancers. BRCA-related cancers include:  Breast.  Ovarian.  Tubal.  Peritoneal cancers.  Results of the assessment will determine the need for  genetic counseling and BRCA1 and BRCA2 testing. Cervical Cancer Your health care provider may recommend that you be screened regularly for cancer of the pelvic organs (ovaries, uterus, and vagina). This screening involves a pelvic examination, including checking for microscopic changes to the surface of your cervix (Pap test). You may be encouraged to have this screening done every 3 years, beginning at age 18.  For women ages 32-65, health care providers may recommend pelvic exams and Pap testing every 3 years, or they may recommend the Pap and pelvic exam, combined with testing for human papilloma virus (HPV), every 5 years. Some types of HPV increase your risk of cervical cancer. Testing for HPV may also be done on women of any age with unclear Pap test results.  Other health care providers may not recommend any screening for nonpregnant women who are considered low risk for pelvic cancer and who do not have symptoms. Ask your health care provider if a screening pelvic exam is right for you.  If you have had past treatment for cervical cancer or a condition that could lead to cancer, you need Pap tests and screening for cancer for at least 20 years after your treatment. If Pap tests have been discontinued, your risk factors (such as having a new sexual partner) need to be reassessed to determine if screening should resume. Some women have medical problems that increase the chance of getting cervical cancer. In these cases, your health care provider may recommend more frequent screening and Pap tests. Colorectal Cancer  This type of cancer can be detected and often prevented.  Routine colorectal cancer screening usually begins at 38 years of age and continues through 38 years of age.  Your health care provider may recommend screening at an earlier age if you have risk factors for colon cancer.  Your health care provider may also recommend using home test kits to check for hidden blood in the  stool.  A small camera at the end of a tube can be used to examine your colon directly (sigmoidoscopy or colonoscopy). This is done to check for the earliest forms of colorectal cancer.  Routine screening usually begins at age 63.  Direct examination of the colon should be repeated every 5-10 years through 38 years of age. However, you may need to be screened more often if early forms of precancerous polyps or small growths are found. Skin Cancer  Check your skin from head to toe regularly.  Tell your health care provider about any new moles or changes in moles, especially if there is a change in a mole's shape or color.  Also tell your health care provider if you have a mole that is larger than the size of a pencil eraser.  Always use sunscreen. Apply sunscreen liberally and repeatedly throughout the day.  Protect yourself by wearing long sleeves, pants, a wide-brimmed hat, and sunglasses whenever you are outside. HEART DISEASE, DIABETES, AND HIGH BLOOD  PRESSURE   High blood pressure causes heart disease and increases the risk of stroke. High blood pressure is more likely to develop in:  People who have blood pressure in the high end of the normal range (130-139/85-89 mm Hg).  People who are overweight or obese.  People who are African American.  If you are 71-47 years of age, have your blood pressure checked every 3-5 years. If you are 16 years of age or older, have your blood pressure checked every year. You should have your blood pressure measured twice--once when you are at a hospital or clinic, and once when you are not at a hospital or clinic. Record the average of the two measurements. To check your blood pressure when you are not at a hospital or clinic, you can use:  An automated blood pressure machine at a pharmacy.  A home blood pressure monitor.  If you are between 56 years and 71 years old, ask your health care provider if you should take aspirin to prevent  strokes.  Have regular diabetes screenings. This involves taking a blood sample to check your fasting blood sugar level.  If you are at a normal weight and have a low risk for diabetes, have this test once every three years after 38 years of age.  If you are overweight and have a high risk for diabetes, consider being tested at a younger age or more often. PREVENTING INFECTION  Hepatitis B  If you have a higher risk for hepatitis B, you should be screened for this virus. You are considered at high risk for hepatitis B if:  You were born in a country where hepatitis B is common. Ask your health care provider which countries are considered high risk.  Your parents were born in a high-risk country, and you have not been immunized against hepatitis B (hepatitis B vaccine).  You have HIV or AIDS.  You use needles to inject street drugs.  You live with someone who has hepatitis B.  You have had sex with someone who has hepatitis B.  You get hemodialysis treatment.  You take certain medicines for conditions, including cancer, organ transplantation, and autoimmune conditions. Hepatitis C  Blood testing is recommended for:  Everyone born from [birth year] through 1963.  Anyone with known risk factors for hepatitis C. Sexually transmitted infections (STIs)  You should be screened for sexually transmitted infections (STIs) including gonorrhea and chlamydia if:  You are sexually active and are younger than 38 years of age.  You are older than 38 years of age and your health care provider tells you that you are at risk for this type of infection.  Your sexual activity has changed since you were last screened and you are at an increased risk for chlamydia or gonorrhea. Ask your health care provider if you are at risk.  If you do not have HIV, but are at risk, it may be recommended that you take a prescription medicine daily to prevent HIV infection. This is called pre-exposure prophylaxis  (PrEP). You are considered at risk if:  You are sexually active and do not regularly use condoms or know the HIV status of your partner(s).  You take drugs by injection.  You are sexually active with a partner who has HIV. Talk with your health care provider about whether you are at high risk of being infected with HIV. If you choose to begin PrEP, you should first be tested for HIV. You should then be tested every 3  months for as long as you are taking PrEP.  PREGNANCY   If you are premenopausal and you may become pregnant, ask your health care provider about preconception counseling.  If you may become pregnant, take 400 to 800 micrograms (mcg) of folic acid every day.  If you want to prevent pregnancy, talk to your health care provider about birth control (contraception). OSTEOPOROSIS AND MENOPAUSE   Osteoporosis is a disease in which the bones lose minerals and strength with aging. This can result in serious bone fractures. Your risk for osteoporosis can be identified using a bone density scan.  If you are 30 years of age or older, or if you are at risk for osteoporosis and fractures, ask your health care provider if you should be screened.  Ask your health care provider whether you should take a calcium or vitamin D supplement to lower your risk for osteoporosis.  Menopause may have certain physical symptoms and risks.  Hormone replacement therapy may reduce some of these symptoms and risks. Talk to your health care provider about whether hormone replacement therapy is right for you.  HOME CARE INSTRUCTIONS   Schedule regular health, dental, and eye exams.  Stay current with your immunizations.   Do not use any tobacco products including cigarettes, chewing tobacco, or electronic cigarettes.  If you are pregnant, do not drink alcohol.  If you are breastfeeding, limit how much and how often you drink alcohol.  Limit alcohol intake to no more than 1 drink per day for  nonpregnant women. One drink equals 12 ounces of beer, 5 ounces of wine, or 1 ounces of hard liquor.  Do not use street drugs.  Do not share needles.  Ask your health care provider for help if you need support or information about quitting drugs.  Tell your health care provider if you often feel depressed.  Tell your health care provider if you have ever been abused or do not feel safe at home.   This information is not intended to replace advice given to you by your health care provider. Make sure you discuss any questions you have with your health care provider.   Document Released: 04/01/2011 Document Revised: 10/07/2014 Document Reviewed: 08/18/2013 Elsevier Interactive Patient Education [employer].

## 2016-04-10 NOTE — Addendum Note (Signed)
Addended by: Marchia Bond on: 04/10/2016 04:25 PM   Modules accepted: Miquel Dunn

## 2016-04-10 NOTE — Progress Notes (Signed)
Pre visit review using our clinic review tool, if applicable. No additional management support is needed unless otherwise documented below in the visit note. 

## 2016-04-10 NOTE — Assessment & Plan Note (Signed)
Preventative protocols reviewed and updated unless pt declined. Discussed healthy diet and lifestyle.  

## 2016-04-10 NOTE — Assessment & Plan Note (Signed)
Discussed healthy diet and lifestyle changes to continue sustainable weight loss.

## 2016-04-10 NOTE — Assessment & Plan Note (Signed)
Chronic, stable. Continue current regimen. 

## 2016-04-10 NOTE — Assessment & Plan Note (Signed)
Breakthrough sxs on OTC dosing. Will refill 40mg  omeprazole and see if new insurance will cover.

## 2016-04-10 NOTE — Assessment & Plan Note (Signed)
Off beta blocker over last 1.5 months. Not noticing any significant palpitations. Discussed PRN use from here on out.

## 2017-03-04 ENCOUNTER — Other Ambulatory Visit: Payer: Self-pay | Admitting: Family Medicine

## 2017-03-30 ENCOUNTER — Other Ambulatory Visit: Payer: Self-pay | Admitting: Family Medicine

## 2017-03-30 DIAGNOSIS — I10 Essential (primary) hypertension: Secondary | ICD-10-CM

## 2017-04-03 ENCOUNTER — Other Ambulatory Visit (INDEPENDENT_AMBULATORY_CARE_PROVIDER_SITE_OTHER): Payer: Managed Care, Other (non HMO)

## 2017-04-03 DIAGNOSIS — I10 Essential (primary) hypertension: Secondary | ICD-10-CM | POA: Diagnosis not present

## 2017-04-03 LAB — BASIC METABOLIC PANEL
BUN: 13 mg/dL (ref 6–23)
CHLORIDE: 103 meq/L (ref 96–112)
CO2: 26 mEq/L (ref 19–32)
CREATININE: 0.8 mg/dL (ref 0.40–1.20)
Calcium: 9.3 mg/dL (ref 8.4–10.5)
GFR: 84.82 mL/min (ref >60.00)
GLUCOSE: 96 mg/dL (ref 70–99)
POTASSIUM: 4.4 meq/L (ref 3.5–5.1)
Sodium: 137 mEq/L (ref 135–145)

## 2017-04-03 LAB — TSH: TSH: 1.36 u[IU]/mL (ref 0.35–4.50)

## 2017-04-08 ENCOUNTER — Other Ambulatory Visit: Payer: BLUE CROSS/BLUE SHIELD

## 2017-04-15 ENCOUNTER — Ambulatory Visit (INDEPENDENT_AMBULATORY_CARE_PROVIDER_SITE_OTHER): Payer: Managed Care, Other (non HMO) | Admitting: Family Medicine

## 2017-04-15 ENCOUNTER — Encounter: Payer: Self-pay | Admitting: Family Medicine

## 2017-04-15 VITALS — BP 118/74 | HR 104 | Temp 98.5°F | Ht 65.75 in | Wt 186.8 lb

## 2017-04-15 DIAGNOSIS — K219 Gastro-esophageal reflux disease without esophagitis: Secondary | ICD-10-CM | POA: Diagnosis not present

## 2017-04-15 DIAGNOSIS — E669 Obesity, unspecified: Secondary | ICD-10-CM

## 2017-04-15 DIAGNOSIS — I493 Ventricular premature depolarization: Secondary | ICD-10-CM | POA: Diagnosis not present

## 2017-04-15 DIAGNOSIS — I10 Essential (primary) hypertension: Secondary | ICD-10-CM | POA: Diagnosis not present

## 2017-04-15 DIAGNOSIS — Z0000 Encounter for general adult medical examination without abnormal findings: Secondary | ICD-10-CM

## 2017-04-15 MED ORDER — LISINOPRIL 5 MG PO TABS
ORAL_TABLET | ORAL | 3 refills | Status: DC
Start: 2017-04-15 — End: 2018-04-16

## 2017-04-15 NOTE — Assessment & Plan Note (Addendum)
Discussed healthy diet and lifestyle changes to affect sustainable weight loss.  Main trouble is lack of motivation to sustain healthy diet and exercise regimen. She has trainer, participates in multiple exercise routines, as well as weight watchers as needed. She is looking into cosmetic surgery with local surgeon, goal BMI <30.

## 2017-04-15 NOTE — Assessment & Plan Note (Signed)
Chronic, stable. Continue current regimen. Declines trial off antihypertensive.

## 2017-04-15 NOTE — Assessment & Plan Note (Signed)
Largely diet controlled.

## 2017-04-15 NOTE — Patient Instructions (Addendum)
You are doing well today.  Keep working on exercise regimen, healthy eating.  Return as needed or in 1 year for next physical.  Health Maintenance, Female Adopting a healthy lifestyle and getting preventive care can go a long way to promote health and wellness. Talk with your health care provider about what schedule of regular examinations is right for you. This is a good chance for you to check in with your provider about disease prevention and staying healthy. In between checkups, there are plenty of things you can do on your own. Experts have done a lot of research about which lifestyle changes and preventive measures are most likely to keep you healthy. Ask your health care provider for more information. Weight and diet Eat a healthy diet  Be sure to include plenty of vegetables, fruits, low-fat dairy products, and lean protein.  Do not eat a lot of foods high in solid fats, added sugars, or salt.  Get regular exercise. This is one of the most important things you can do for your health. ? Most adults should exercise for at least 150 minutes each week. The exercise should increase your heart rate and make you sweat (moderate-intensity exercise). ? Most adults should also do strengthening exercises at least twice a week. This is in addition to the moderate-intensity exercise.  Maintain a healthy weight  Body mass index (BMI) is a measurement that can be used to identify possible weight problems. It estimates body fat based on height and weight. Your health care provider can help determine your BMI and help you achieve or maintain a healthy weight.  For females 72 years of age and older: ? A BMI below 18.5 is considered underweight. ? A BMI of 18.5 to 24.9 is normal. ? A BMI of 25 to 29.9 is considered overweight. ? A BMI of 30 and above is considered obese.  Watch levels of cholesterol and blood lipids  You should start having your blood tested for lipids and cholesterol at 39 years  of age, then have this test every 5 years.  You may need to have your cholesterol levels checked more often if: ? Your lipid or cholesterol levels are high. ? You are older than 39 years of age. ? You are at high risk for heart disease.  Cancer screening Lung Cancer  Lung cancer screening is recommended for adults 81-31 years old who are at high risk for lung cancer because of a history of smoking.  A yearly low-dose CT scan of the lungs is recommended for people who: ? Currently smoke. ? Have quit within the past 15 years. ? Have at least a 30-pack-year history of smoking. A pack year is smoking an average of one pack of cigarettes a day for 1 year.  Yearly screening should continue until it has been 15 years since you quit.  Yearly screening should stop if you develop a health problem that would prevent you from having lung cancer treatment.  Breast Cancer  Practice breast self-awareness. This means understanding how your breasts normally appear and feel.  It also means doing regular breast self-exams. Let your health care provider know about any changes, no matter how small.  If you are in your 20s or 30s, you should have a clinical breast exam (CBE) by a health care provider every 1-3 years as part of a regular health exam.  If you are 21 or older, have a CBE every year. Also consider having a breast X-ray (mammogram) every year.  If you have a family history of breast cancer, talk to your health care provider about genetic screening.  If you are at high risk for breast cancer, talk to your health care provider about having an MRI and a mammogram every year.  Breast cancer gene (BRCA) assessment is recommended for women who have family members with BRCA-related cancers. BRCA-related cancers include: ? Breast. ? Ovarian. ? Tubal. ? Peritoneal cancers.  Results of the assessment will determine the need for genetic counseling and BRCA1 and BRCA2 testing.  Cervical  Cancer Your health care provider may recommend that you be screened regularly for cancer of the pelvic organs (ovaries, uterus, and vagina). This screening involves a pelvic examination, including checking for microscopic changes to the surface of your cervix (Pap test). You may be encouraged to have this screening done every 3 years, beginning at age 28.  For women ages 42-65, health care providers may recommend pelvic exams and Pap testing every 3 years, or they may recommend the Pap and pelvic exam, combined with testing for human papilloma virus (HPV), every 5 years. Some types of HPV increase your risk of cervical cancer. Testing for HPV may also be done on women of any age with unclear Pap test results.  Other health care providers may not recommend any screening for nonpregnant women who are considered low risk for pelvic cancer and who do not have symptoms. Ask your health care provider if a screening pelvic exam is right for you.  If you have had past treatment for cervical cancer or a condition that could lead to cancer, you need Pap tests and screening for cancer for at least 20 years after your treatment. If Pap tests have been discontinued, your risk factors (such as having a new sexual partner) need to be reassessed to determine if screening should resume. Some women have medical problems that increase the chance of getting cervical cancer. In these cases, your health care provider may recommend more frequent screening and Pap tests.  Colorectal Cancer  This type of cancer can be detected and often prevented.  Routine colorectal cancer screening usually begins at 39 years of age and continues through 39 years of age.  Your health care provider may recommend screening at an earlier age if you have risk factors for colon cancer.  Your health care provider may also recommend using home test kits to check for hidden blood in the stool.  A small camera at the end of a tube can be used to  examine your colon directly (sigmoidoscopy or colonoscopy). This is done to check for the earliest forms of colorectal cancer.  Routine screening usually begins at age 21.  Direct examination of the colon should be repeated every 5-10 years through 39 years of age. However, you may need to be screened more often if early forms of precancerous polyps or small growths are found.  Skin Cancer  Check your skin from head to toe regularly.  Tell your health care provider about any new moles or changes in moles, especially if there is a change in a mole's shape or color.  Also tell your health care provider if you have a mole that is larger than the size of a pencil eraser.  Always use sunscreen. Apply sunscreen liberally and repeatedly throughout the day.  Protect yourself by wearing long sleeves, pants, a wide-brimmed hat, and sunglasses whenever you are outside.  Heart disease, diabetes, and high blood pressure  High blood pressure causes heart disease and  increases the risk of stroke. High blood pressure is more likely to develop in: ? People who have blood pressure in the high end of the normal range (130-139/85-89 mm Hg). ? People who are overweight or obese. ? People who are African American.  If you are 73-54 years of age, have your blood pressure checked every 3-5 years. If you are 23 years of age or older, have your blood pressure checked every year. You should have your blood pressure measured twice-once when you are at a hospital or clinic, and once when you are not at a hospital or clinic. Record the average of the two measurements. To check your blood pressure when you are not at a hospital or clinic, you can use: ? An automated blood pressure machine at a pharmacy. ? A home blood pressure monitor.  If you are between 67 years and 20 years old, ask your health care provider if you should take aspirin to prevent strokes.  Have regular diabetes screenings. This involves taking a  blood sample to check your fasting blood sugar level. ? If you are at a normal weight and have a low risk for diabetes, have this test once every three years after 39 years of age. ? If you are overweight and have a high risk for diabetes, consider being tested at a younger age or more often. Preventing infection Hepatitis B  If you have a higher risk for hepatitis B, you should be screened for this virus. You are considered at high risk for hepatitis B if: ? You were born in a country where hepatitis B is common. Ask your health care provider which countries are considered high risk. ? Your parents were born in a high-risk country, and you have not been immunized against hepatitis B (hepatitis B vaccine). ? You have HIV or AIDS. ? You use needles to inject street drugs. ? You live with someone who has hepatitis B. ? You have had sex with someone who has hepatitis B. ? You get hemodialysis treatment. ? You take certain medicines for conditions, including cancer, organ transplantation, and autoimmune conditions.  Hepatitis C  Blood testing is recommended for: ? Everyone born from [birth year] through 1963. ? Anyone with known risk factors for hepatitis C.  Sexually transmitted infections (STIs)  You should be screened for sexually transmitted infections (STIs) including gonorrhea and chlamydia if: ? You are sexually active and are younger than 39 years of age. ? You are older than 39 years of age and your health care provider tells you that you are at risk for this type of infection. ? Your sexual activity has changed since you were last screened and you are at an increased risk for chlamydia or gonorrhea. Ask your health care provider if you are at risk.  If you do not have HIV, but are at risk, it may be recommended that you take a prescription medicine daily to prevent HIV infection. This is called pre-exposure prophylaxis (PrEP). You are considered at risk if: ? You are sexually active and  do not regularly use condoms or know the HIV status of your partner(s). ? You take drugs by injection. ? You are sexually active with a partner who has HIV.  Talk with your health care provider about whether you are at high risk of being infected with HIV. If you choose to begin PrEP, you should first be tested for HIV. You should then be tested every 3 months for as long as you are taking  PrEP. Pregnancy  If you are premenopausal and you may become pregnant, ask your health care provider about preconception counseling.  If you may become pregnant, take 400 to 800 micrograms (mcg) of folic acid every day.  If you want to prevent pregnancy, talk to your health care provider about birth control (contraception). Osteoporosis and menopause  Osteoporosis is a disease in which the bones lose minerals and strength with aging. This can result in serious bone fractures. Your risk for osteoporosis can be identified using a bone density scan.  If you are 45 years of age or older, or if you are at risk for osteoporosis and fractures, ask your health care provider if you should be screened.  Ask your health care provider whether you should take a calcium or vitamin D supplement to lower your risk for osteoporosis.  Menopause may have certain physical symptoms and risks.  Hormone replacement therapy may reduce some of these symptoms and risks. Talk to your health care provider about whether hormone replacement therapy is right for you. Follow these instructions at home:  Schedule regular health, dental, and eye exams.  Stay current with your immunizations.  Do not use any tobacco products including cigarettes, chewing tobacco, or electronic cigarettes.  If you are pregnant, do not drink alcohol.  If you are breastfeeding, limit how much and how often you drink alcohol.  Limit alcohol intake to no more than 1 drink per day for nonpregnant women. One drink equals 12 ounces of beer, 5 ounces of  wine, or 1 ounces of hard liquor.  Do not use street drugs.  Do not share needles.  Ask your health care provider for help if you need support or information about quitting drugs.  Tell your health care provider if you often feel depressed.  Tell your health care provider if you have ever been abused or do not feel safe at home. This information is not intended to replace advice given to you by your health care provider. Make sure you discuss any questions you have with your health care provider. Document Released: 04/01/2011 Document Revised: 02/22/2016 Document Reviewed: 06/20/2015 Elsevier Interactive Patient Education  [employer].

## 2017-04-15 NOTE — Assessment & Plan Note (Signed)
Preventative protocols reviewed and updated unless pt declined. Discussed healthy diet and lifestyle.  

## 2017-04-15 NOTE — Progress Notes (Signed)
BP 118/74   Pulse (!) 104   Temp 98.5 F (36.9 C) (Oral)   Ht 5' 5.75" (1.67 m)   Wt 186 lb 12 oz (84.7 kg)   LMP 03/23/2017   SpO2 98%   BMI 30.37 kg/m    CC: CPE Subjective:    Patient ID: Jasmine Dixon, female    DOB: Oct 22, 1975, 41 y.o.   MRN: [id number]  HPI: Jasmine Dixon is a 41 y.o. female presenting on 04/15/2017 for Annual Exam   h/o symptomatic PVCs now controlled on beta blocker PRN a few times a month. Work up included echo and exercise tolerance test.   GERD - diet controlled.   Completed NP school 2016. Initially worked with [employer]. 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   h/o preterm labor at 89 6 wks, lost son, twin daughter doing well.  Preventative: Well woman at [facility] - Dr. Georgiann Cocker at [place].Sees yearly. h/o abnl paps, removed abnl cells s/p LEEP. Normal since, now sees Q3 years. Upcoming appt next week.  Flu shot yearly at work Tdap - 09/2012  Seat belt use discussed.  Avoids sun. No changing moles on skin.  LMP 03/23/2017, regular cycles.  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 Interim medical history since our last visit reviewed. Allergies and medications reviewed and updated. Outpatient Medications Prior to Visit  Medication Sig Dispense Refill  . metoprolol tartrate (LOPRESSOR) 25 MG tablet Take 1 tablet (25 mg total) by mouth 2 (two) times daily as needed (palpitations). 60 tablet 3  . lisinopril (PRINIVIL,ZESTRIL) 5 MG tablet TAKE 1 TABLET (5 MG TOTAL) BY MOUTH DAILY. 90 tablet 0  . norgestimate-ethinyl estradiol (SPRINTEC 28)  0.25-35 MG-MCG tablet Take 1 tablet by mouth daily. **MUST HAVE PHYSICAL FOR FURTHER REFILLS** 28 tablet 0  . omeprazole (PRILOSEC) 40 MG capsule Take 1 capsule (40 mg total) by mouth daily. 90 capsule 3   No facility-administered medications prior to visit.      Per HPI unless specifically indicated in ROS section below Review of Systems  Constitutional: Negative for activity change, appetite change, chills, fatigue, fever and unexpected weight change.  HENT: Negative for hearing loss.   Eyes: Negative for visual disturbance.  Respiratory: Positive for cough (current URI) and chest tightness (with exposure to 2nd hand smoke). Negative for shortness of breath and wheezing.   Cardiovascular: Positive for palpitations (occasional - treated with B blocker). Negative for chest pain and leg swelling.  Gastrointestinal: Negative for abdominal distention, abdominal pain, blood in stool, constipation, diarrhea, nausea and vomiting.  Genitourinary: Negative for difficulty urinating and hematuria.  Musculoskeletal: Negative for arthralgias, myalgias and neck pain.  Skin: Negative for rash.  Neurological: Negative for dizziness, seizures, syncope and headaches.  Hematological: Negative for adenopathy. Does not bruise/bleed easily.  Psychiatric/Behavioral: Negative for dysphoric mood. The patient is not nervous/anxious.        Objective:    BP 118/74   Pulse (!) 104   Temp 98.5 F (36.9 C) (Oral)   Ht 5' 5.75" (1.67 m)   Wt 186 lb 12 oz (84.7 kg)   LMP 03/23/2017  SpO2 98%   BMI 30.37 kg/m   Wt Readings from Last 3 Encounters:  04/15/17 186 lb 12 oz (84.7 kg)  04/10/16 172 lb (78 kg)  03/01/15 176 lb 4 oz (79.9 kg)    Physical Exam  Constitutional: She is oriented to person, place, and time. She appears well-developed and well-nourished. No distress.  HENT:  Head: Normocephalic and atraumatic.  Right Ear: Hearing, tympanic membrane, external ear and ear canal normal.  Left Ear:  Hearing, tympanic membrane, external ear and ear canal normal.  Nose: Nose normal.  Mouth/Throat: Uvula is midline, oropharynx is clear and moist and mucous membranes are normal. No oropharyngeal exudate, posterior oropharyngeal edema or posterior oropharyngeal erythema.  Eyes: Pupils are equal, round, and reactive to light. Conjunctivae and EOM are normal. No scleral icterus.  Neck: Normal range of motion. Neck supple. No thyromegaly present.  Cardiovascular: Normal rate, regular rhythm, normal heart sounds and intact distal pulses.   No murmur heard. Pulses:      Radial pulses are 2+ on the right side, and 2+ on the left side.  Pulmonary/Chest: Effort normal and breath sounds normal. No respiratory distress. She has no wheezes. She has no rales.  Abdominal: Soft. Bowel sounds are normal. She exhibits no distension and no mass. There is no tenderness. There is no rebound and no guarding.  Musculoskeletal: Normal range of motion. She exhibits no edema.  Lymphadenopathy:    She has no cervical adenopathy.  Neurological: She is alert and oriented to person, place, and time.  CN grossly intact, station and gait intact  Skin: Skin is warm and dry. No rash noted.  Psychiatric: She has a normal mood and affect. Her behavior is normal. Judgment and thought content normal.  Nursing note and vitals reviewed.  Results for orders placed or performed in visit on 00/37/04  Basic metabolic panel  Result Value Ref Range   Sodium 137 135 - 145 mEq/L   Potassium 4.4 3.5 - 5.1 mEq/L   Chloride 103 96 - 112 mEq/L   CO2 26 19 - 32 mEq/L   Glucose, Bld 96 70 - 99 mg/dL   BUN 13 6 - 23 mg/dL   Creatinine, Ser 0.80 0.40 - 1.20 mg/dL   Calcium 9.3 8.4 - 10.5 mg/dL   GFR 84.82 >60.00 mL/min  TSH  Result Value Ref Range   TSH 1.36 0.35 - 4.50 uIU/mL      Assessment & Plan:   Problem List Items Addressed This Visit    GERD (gastroesophageal reflux disease)    Largely diet controlled.        Healthcare maintenance - Primary    Preventative protocols reviewed and updated unless pt declined. Discussed healthy diet and lifestyle.       Hypertension, essential (Chronic)    Chronic, stable. Continue current regimen. Declines trial off antihypertensive.       Relevant Medications   lisinopril (PRINIVIL,ZESTRIL) 5 MG tablet   Obesity, Class I, BMI 30-34.9    Discussed healthy diet and lifestyle changes to affect sustainable weight loss.  Main trouble is lack of motivation to sustain healthy diet and exercise regimen. She has trainer, participates in multiple exercise routines, as well as weight watchers as needed. She is looking into cosmetic surgery with local surgeon, goal BMI <30.       Symptomatic PVCs (Chronic)    Well controlled with PRN metoprolol.      Relevant Medications   lisinopril (PRINIVIL,ZESTRIL) 5 MG tablet  Follow up plan: Return in about 1 year (around 04/15/2018) for annual exam, prior fasting for blood work.  Ria Bush, MD

## 2017-04-15 NOTE — Assessment & Plan Note (Signed)
Well controlled with PRN metoprolol.

## 2017-04-21 LAB — HM PAP SMEAR: HM Pap smear: NEGATIVE

## 2018-04-09 ENCOUNTER — Other Ambulatory Visit: Payer: Self-pay | Admitting: Family Medicine

## 2018-04-09 DIAGNOSIS — I10 Essential (primary) hypertension: Secondary | ICD-10-CM

## 2018-04-10 ENCOUNTER — Other Ambulatory Visit: Payer: Self-pay

## 2018-04-16 ENCOUNTER — Encounter: Payer: Self-pay | Admitting: Family Medicine

## 2018-04-16 ENCOUNTER — Ambulatory Visit (INDEPENDENT_AMBULATORY_CARE_PROVIDER_SITE_OTHER): Payer: 59 | Admitting: Family Medicine

## 2018-04-16 VITALS — BP 118/80 | HR 95 | Temp 98.7°F | Ht 64.5 in | Wt 180.5 lb

## 2018-04-16 DIAGNOSIS — Z1231 Encounter for screening mammogram for malignant neoplasm of breast: Secondary | ICD-10-CM

## 2018-04-16 DIAGNOSIS — Z1239 Encounter for other screening for malignant neoplasm of breast: Secondary | ICD-10-CM

## 2018-04-16 DIAGNOSIS — Z0000 Encounter for general adult medical examination without abnormal findings: Secondary | ICD-10-CM

## 2018-04-16 DIAGNOSIS — E66811 Obesity, class 1: Secondary | ICD-10-CM

## 2018-04-16 DIAGNOSIS — E669 Obesity, unspecified: Secondary | ICD-10-CM

## 2018-04-16 DIAGNOSIS — I493 Ventricular premature depolarization: Secondary | ICD-10-CM | POA: Diagnosis not present

## 2018-04-16 DIAGNOSIS — I10 Essential (primary) hypertension: Secondary | ICD-10-CM

## 2018-04-16 NOTE — Assessment & Plan Note (Addendum)
Preventative protocols reviewed and updated unless pt declined. Discussed healthy diet and lifestyle.  No labs this year. Will recheck next year.  Snellen visual acuity passed today.

## 2018-04-16 NOTE — Progress Notes (Signed)
BP 118/80 (BP Location: Left Arm, Patient Position: Sitting, Cuff Size: Normal)   Pulse 95   Temp 98.7 F (37.1 C) (Oral)   Ht 5' 4.5" (1.638 m)   Wt 180 lb 8 oz (81.9 kg)   LMP 04/06/2018   SpO2 99%   BMI 30.50 kg/m    Visual Acuity Screening   Right eye Left eye Both eyes  Without correction: 20/20 20/20 20/20   With correction:       CC: CPE Subjective:    Patient ID: Jasmine Dixon, female    DOB: 05-19-1976, 42 y.o.   MRN: [id number]  HPI: Jasmine Dixon is a 42 y.o. female presenting on 04/16/2018 for Annual Exam   Symptomatic PVCs controlled on rare beta blocker PRN. Work up included echo and exercise tolerance test. Was able to taper off ACEI.   GERD - diet controlled.   Completed NP school 2016. Initially worked with [employer]. 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   Preventative: Well woman at [facility] - Dr. Georgiann Cocker at [place].Sees yearly. h/o abnl paps, removed abnl cells s/p LEEP. Normal pap last year.  Mammogram - requests baseline mammogram Flu shot yearly at work Tdap - 09/2012  Seat belt use discussed.  Avoids sun. No changing moles on skin.  Non smoker Alcohol - occasional  Dentist Q24mo Eye exam - not recently LMP 04/06/2018, regular cycles.  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 Interim medical history since our last visit reviewed. Allergies and medications reviewed and updated. Outpatient Medications Prior to Visit  Medication Sig Dispense Refill  . metoprolol tartrate (LOPRESSOR) 25 MG tablet Take 1 tablet (25 mg total) by mouth 2 (two) times daily as needed (palpitations). 60 tablet 3  . lisinopril (PRINIVIL,ZESTRIL) 5 MG  tablet TAKE 1 TABLET (5 MG TOTAL) BY MOUTH DAILY. (Patient not taking: Reported on 04/16/2018) 90 tablet 3   No facility-administered medications prior to visit.      Per HPI unless specifically indicated in ROS section below Review of Systems  Constitutional: Negative for activity change, appetite change, chills, fatigue, fever and unexpected weight change.  HENT: Negative for hearing loss.   Eyes: Negative for visual disturbance.  Respiratory: Negative for cough, chest tightness, shortness of breath and wheezing.   Cardiovascular: Negative for chest pain, palpitations and leg swelling.  Gastrointestinal: Negative for abdominal distention, abdominal pain, blood in stool, constipation, diarrhea, nausea and vomiting.  Genitourinary: Negative for difficulty urinating and hematuria.  Musculoskeletal: Negative for arthralgias, myalgias and neck pain.  Skin: Negative for rash.  Neurological: Negative for dizziness, seizures, syncope and headaches.  Hematological: Negative for adenopathy. Does not bruise/bleed easily.  Psychiatric/Behavioral: Negative for dysphoric mood. The patient is not nervous/anxious.        Objective:    BP 118/80 (BP Location: Left Arm, Patient Position: Sitting, Cuff Size: Normal)   Pulse 95   Temp 98.7 F (37.1 C) (Oral)   Ht 5' 4.5" (1.638 m)   Wt 180 lb 8 oz (81.9 kg)   LMP 04/06/2018   SpO2 99%   BMI 30.50 kg/m   Wt Readings from Last 3 Encounters:  04/16/18 180 lb 8 oz (81.9 kg)  04/15/17 186 lb  12 oz (84.7 kg)  04/10/16 172 lb (78 kg)    Physical Exam  Constitutional: She is oriented to person, place, and time. She appears well-developed and well-nourished. No distress.  HENT:  Head: Normocephalic and atraumatic.  Right Ear: Hearing, tympanic membrane, external ear and ear canal normal.  Left Ear: Hearing, tympanic membrane, external ear and ear canal normal.  Nose: Nose normal.  Mouth/Throat: Uvula is midline, oropharynx is clear and moist and  mucous membranes are normal. No oropharyngeal exudate, posterior oropharyngeal edema or posterior oropharyngeal erythema.  Eyes: Pupils are equal, round, and reactive to light. Conjunctivae and EOM are normal. No scleral icterus.  Neck: Normal range of motion. Neck supple. No thyromegaly present.  Cardiovascular: Normal rate, regular rhythm, normal heart sounds and intact distal pulses.  No murmur heard. Pulses:      Radial pulses are 2+ on the right side, and 2+ on the left side.  Pulmonary/Chest: Effort normal and breath sounds normal. No respiratory distress. She has no wheezes. She has no rales.  Abdominal: Soft. Bowel sounds are normal. She exhibits no distension and no mass. There is no tenderness. There is no rebound and no guarding.  Musculoskeletal: Normal range of motion. She exhibits no edema.  Lymphadenopathy:    She has no cervical adenopathy.  Neurological: She is alert and oriented to person, place, and time.  CN grossly intact, station and gait intact  Skin: Skin is warm and dry. No rash noted.  Psychiatric: She has a normal mood and affect. Her behavior is normal. Judgment and thought content normal.  Nursing note and vitals reviewed.  Results for orders placed or performed in visit on 65/46/50  Basic metabolic panel  Result Value Ref Range   Sodium 137 135 - 145 mEq/L   Potassium 4.4 3.5 - 5.1 mEq/L   Chloride 103 96 - 112 mEq/L   CO2 26 19 - 32 mEq/L   Glucose, Bld 96 70 - 99 mg/dL   BUN 13 6 - 23 mg/dL   Creatinine, Ser 0.80 0.40 - 1.20 mg/dL   Calcium 9.3 8.4 - 10.5 mg/dL   GFR 84.82 >60.00 mL/min  TSH  Result Value Ref Range   TSH 1.36 0.35 - 4.50 uIU/mL      Assessment & Plan:   Problem List Items Addressed This Visit    Symptomatic PVCs (Chronic)    Stable with rare metoprolol PRN.       Obesity, Class I, BMI 30-34.9    Encouraged ongoing efforts at healthy diet and lifestyle for sustainable weight loss.       Hypertension, essential (Chronic)     Chronic, stable. Has been able to taper off lisinopril (was having some low readings). Stable off regular antihypertensive.       Healthcare maintenance - Primary    Preventative protocols reviewed and updated unless pt declined. Discussed healthy diet and lifestyle.  No labs this year. Will recheck next year.  Snellen visual acuity passed today.        Other Visit Diagnoses    Breast cancer screening       Relevant Orders   MM Digital Screening       No orders of the defined types were placed in this encounter.  Orders Placed This Encounter  Procedures  . MM Digital Screening    Standing Status:   Future    Standing Expiration Date:   06/18/2019    Order Specific Question:   Reason for Exam (SYMPTOM  OR DIAGNOSIS REQUIRED)    Answer:   breast cancer screening    Order Specific Question:   Is the patient pregnant?    Answer:   No    Order Specific Question:   Preferred imaging location?    Answer:   [facility]    Follow up plan: Return in about 1 year (around 04/17/2019) for annual exam, prior fasting for blood work.  Ria Bush, MD

## 2018-04-16 NOTE — Assessment & Plan Note (Signed)
Encouraged ongoing efforts at healthy diet and lifestyle for sustainable weight loss.

## 2018-04-16 NOTE — Patient Instructions (Addendum)
We will refer you for mammogram Vision screen today.  Labs next year.  You are doing well today!  Keep an eye on blood pressures.  Health Maintenance, Female Adopting a healthy lifestyle and getting preventive care can go a long way to promote health and wellness. Talk with your health care provider about what schedule of regular examinations is right for you. This is a good chance for you to check in with your provider about disease prevention and staying healthy. In between checkups, there are plenty of things you can do on your own. Experts have done a lot of research about which lifestyle changes and preventive measures are most likely to keep you healthy. Ask your health care provider for more information. Weight and diet Eat a healthy diet  Be sure to include plenty of vegetables, fruits, low-fat dairy products, and lean protein.  Do not eat a lot of foods high in solid fats, added sugars, or salt.  Get regular exercise. This is one of the most important things you can do for your health. ? Most adults should exercise for at least 150 minutes each week. The exercise should increase your heart rate and make you sweat (moderate-intensity exercise). ? Most adults should also do strengthening exercises at least twice a week. This is in addition to the moderate-intensity exercise.  Maintain a healthy weight  Body mass index (BMI) is a measurement that can be used to identify possible weight problems. It estimates body fat based on height and weight. Your health care provider can help determine your BMI and help you achieve or maintain a healthy weight.  For females 21 years of age and older: ? A BMI below 18.5 is considered underweight. ? A BMI of 18.5 to 24.9 is normal. ? A BMI of 25 to 29.9 is considered overweight. ? A BMI of 30 and above is considered obese.  Watch levels of cholesterol and blood lipids  You should start having your blood tested for lipids and cholesterol at 40  years of age, then have this test every 5 years.  You may need to have your cholesterol levels checked more often if: ? Your lipid or cholesterol levels are high. ? You are older than 40 years of age. ? You are at high risk for heart disease.  Cancer screening Lung Cancer  Lung cancer screening is recommended for adults 46-21 years old who are at high risk for lung cancer because of a history of smoking.  A yearly low-dose CT scan of the lungs is recommended for people who: ? Currently smoke. ? Have quit within the past 15 years. ? Have at least a 30-pack-year history of smoking. A pack year is smoking an average of one pack of cigarettes a day for 1 year.  Yearly screening should continue until it has been 15 years since you quit.  Yearly screening should stop if you develop a health problem that would prevent you from having lung cancer treatment.  Breast Cancer  Practice breast self-awareness. This means understanding how your breasts normally appear and feel.  It also means doing regular breast self-exams. Let your health care provider know about any changes, no matter how small.  If you are in your 20s or 30s, you should have a clinical breast exam (CBE) by a health care provider every 1-3 years as part of a regular health exam.  If you are 57 or older, have a CBE every year. Also consider having a breast X-ray (mammogram) every  year.  If you have a family history of breast cancer, talk to your health care provider about genetic screening.  If you are at high risk for breast cancer, talk to your health care provider about having an MRI and a mammogram every year.  Breast cancer gene (BRCA) assessment is recommended for women who have family members with BRCA-related cancers. BRCA-related cancers include: ? Breast. ? Ovarian. ? Tubal. ? Peritoneal cancers.  Results of the assessment will determine the need for genetic counseling and BRCA1 and BRCA2 testing.  Cervical  Cancer Your health care provider may recommend that you be screened regularly for cancer of the pelvic organs (ovaries, uterus, and vagina). This screening involves a pelvic examination, including checking for microscopic changes to the surface of your cervix (Pap test). You may be encouraged to have this screening done every 3 years, beginning at age 27.  For women ages 26-65, health care providers may recommend pelvic exams and Pap testing every 3 years, or they may recommend the Pap and pelvic exam, combined with testing for human papilloma virus (HPV), every 5 years. Some types of HPV increase your risk of cervical cancer. Testing for HPV may also be done on women of any age with unclear Pap test results.  Other health care providers may not recommend any screening for nonpregnant women who are considered low risk for pelvic cancer and who do not have symptoms. Ask your health care provider if a screening pelvic exam is right for you.  If you have had past treatment for cervical cancer or a condition that could lead to cancer, you need Pap tests and screening for cancer for at least 20 years after your treatment. If Pap tests have been discontinued, your risk factors (such as having a new sexual partner) need to be reassessed to determine if screening should resume. Some women have medical problems that increase the chance of getting cervical cancer. In these cases, your health care provider may recommend more frequent screening and Pap tests.  Colorectal Cancer  This type of cancer can be detected and often prevented.  Routine colorectal cancer screening usually begins at 40 years of age and continues through 40 years of age.  Your health care provider may recommend screening at an earlier age if you have risk factors for colon cancer.  Your health care provider may also recommend using home test kits to check for hidden blood in the stool.  A small camera at the end of a tube can be used to  examine your colon directly (sigmoidoscopy or colonoscopy). This is done to check for the earliest forms of colorectal cancer.  Routine screening usually begins at age 16.  Direct examination of the colon should be repeated every 5-10 years through 40 years of age. However, you may need to be screened more often if early forms of precancerous polyps or small growths are found.  Skin Cancer  Check your skin from head to toe regularly.  Tell your health care provider about any new moles or changes in moles, especially if there is a change in a mole's shape or color.  Also tell your health care provider if you have a mole that is larger than the size of a pencil eraser.  Always use sunscreen. Apply sunscreen liberally and repeatedly throughout the day.  Protect yourself by wearing long sleeves, pants, a wide-brimmed hat, and sunglasses whenever you are outside.  Heart disease, diabetes, and high blood pressure  High blood pressure causes heart  disease and increases the risk of stroke. High blood pressure is more likely to develop in: ? People who have blood pressure in the high end of the normal range (130-139/85-89 mm Hg). ? People who are overweight or obese. ? People who are African American.  If you are 68-78 years of age, have your blood pressure checked every 3-5 years. If you are 61 years of age or older, have your blood pressure checked every year. You should have your blood pressure measured twice-once when you are at a hospital or clinic, and once when you are not at a hospital or clinic. Record the average of the two measurements. To check your blood pressure when you are not at a hospital or clinic, you can use: ? An automated blood pressure machine at a pharmacy. ? A home blood pressure monitor.  If you are between 45 years and 15 years old, ask your health care provider if you should take aspirin to prevent strokes.  Have regular diabetes screenings. This involves taking a  blood sample to check your fasting blood sugar level. ? If you are at a normal weight and have a low risk for diabetes, have this test once every three years after 40 years of age. ? If you are overweight and have a high risk for diabetes, consider being tested at a younger age or more often. Preventing infection Hepatitis B  If you have a higher risk for hepatitis B, you should be screened for this virus. You are considered at high risk for hepatitis B if: ? You were born in a country where hepatitis B is common. Ask your health care provider which countries are considered high risk. ? Your parents were born in a high-risk country, and you have not been immunized against hepatitis B (hepatitis B vaccine). ? You have HIV or AIDS. ? You use needles to inject street drugs. ? You live with someone who has hepatitis B. ? You have had sex with someone who has hepatitis B. ? You get hemodialysis treatment. ? You take certain medicines for conditions, including cancer, organ transplantation, and autoimmune conditions.  Hepatitis C  Blood testing is recommended for: ? Everyone born from [birth year] through 1963. ? Anyone with known risk factors for hepatitis C.  Sexually transmitted infections (STIs)  You should be screened for sexually transmitted infections (STIs) including gonorrhea and chlamydia if: ? You are sexually active and are younger than 40 years of age. ? You are older than 40 years of age and your health care provider tells you that you are at risk for this type of infection. ? Your sexual activity has changed since you were last screened and you are at an increased risk for chlamydia or gonorrhea. Ask your health care provider if you are at risk.  If you do not have HIV, but are at risk, it may be recommended that you take a prescription medicine daily to prevent HIV infection. This is called pre-exposure prophylaxis (PrEP). You are considered at risk if: ? You are sexually active and  do not regularly use condoms or know the HIV status of your partner(s). ? You take drugs by injection. ? You are sexually active with a partner who has HIV.  Talk with your health care provider about whether you are at high risk of being infected with HIV. If you choose to begin PrEP, you should first be tested for HIV. You should then be tested every 3 months for as long as you  are taking PrEP. Pregnancy  If you are premenopausal and you may become pregnant, ask your health care provider about preconception counseling.  If you may become pregnant, take 400 to 800 micrograms (mcg) of folic acid every day.  If you want to prevent pregnancy, talk to your health care provider about birth control (contraception). Osteoporosis and menopause  Osteoporosis is a disease in which the bones lose minerals and strength with aging. This can result in serious bone fractures. Your risk for osteoporosis can be identified using a bone density scan.  If you are 58 years of age or older, or if you are at risk for osteoporosis and fractures, ask your health care provider if you should be screened.  Ask your health care provider whether you should take a calcium or vitamin D supplement to lower your risk for osteoporosis.  Menopause may have certain physical symptoms and risks.  Hormone replacement therapy may reduce some of these symptoms and risks. Talk to your health care provider about whether hormone replacement therapy is right for you. Follow these instructions at home:  Schedule regular health, dental, and eye exams.  Stay current with your immunizations.  Do not use any tobacco products including cigarettes, chewing tobacco, or electronic cigarettes.  If you are pregnant, do not drink alcohol.  If you are breastfeeding, limit how much and how often you drink alcohol.  Limit alcohol intake to no more than 1 drink per day for nonpregnant women. One drink equals 12 ounces of beer, 5 ounces of  wine, or 1 ounces of hard liquor.  Do not use street drugs.  Do not share needles.  Ask your health care provider for help if you need support or information about quitting drugs.  Tell your health care provider if you often feel depressed.  Tell your health care provider if you have ever been abused or do not feel safe at home. This information is not intended to replace advice given to you by your health care provider. Make sure you discuss any questions you have with your health care provider. Document Released: 04/01/2011 Document Revised: 02/22/2016 Document Reviewed: 06/20/2015 Elsevier Interactive Patient Education  [employer].

## 2018-04-16 NOTE — Assessment & Plan Note (Signed)
Stable with rare metoprolol PRN.

## 2018-04-16 NOTE — Assessment & Plan Note (Signed)
Chronic, stable. Has been able to taper off lisinopril (was having some low readings). Stable off regular antihypertensive.

## 2018-05-15 ENCOUNTER — Ambulatory Visit
Admission: RE | Admit: 2018-05-15 | Discharge: 2018-05-15 | Disposition: A | Discharge status: Home / Self Care | Payer: 59 | Source: Ambulatory Visit | Attending: Family Medicine | Admitting: Family Medicine

## 2018-05-15 DIAGNOSIS — Z1239 Encounter for other screening for malignant neoplasm of breast: Secondary | ICD-10-CM

## 2018-05-15 DIAGNOSIS — Z1231 Encounter for screening mammogram for malignant neoplasm of breast: Secondary | ICD-10-CM | POA: Diagnosis not present

## 2018-05-15 LAB — HM MAMMOGRAPHY

## 2018-05-18 ENCOUNTER — Encounter: Payer: Self-pay | Admitting: Family Medicine

## 2019-04-15 ENCOUNTER — Other Ambulatory Visit: Payer: Self-pay | Admitting: Family Medicine

## 2019-04-15 DIAGNOSIS — Z1231 Encounter for screening mammogram for malignant neoplasm of breast: Secondary | ICD-10-CM

## 2019-04-17 ENCOUNTER — Other Ambulatory Visit: Payer: Self-pay | Admitting: Family Medicine

## 2019-04-17 DIAGNOSIS — I10 Essential (primary) hypertension: Secondary | ICD-10-CM

## 2019-04-17 DIAGNOSIS — I493 Ventricular premature depolarization: Secondary | ICD-10-CM

## 2019-04-20 ENCOUNTER — Other Ambulatory Visit: Payer: Self-pay

## 2019-04-20 ENCOUNTER — Other Ambulatory Visit (INDEPENDENT_AMBULATORY_CARE_PROVIDER_SITE_OTHER): Payer: 59

## 2019-04-20 DIAGNOSIS — I10 Essential (primary) hypertension: Secondary | ICD-10-CM | POA: Diagnosis not present

## 2019-04-20 DIAGNOSIS — I493 Ventricular premature depolarization: Secondary | ICD-10-CM | POA: Diagnosis not present

## 2019-04-20 LAB — CBC WITH DIFFERENTIAL/PLATELET
Basophils Absolute: 0.1 10*3/uL (ref 0.0–0.1)
Basophils Relative: 0.8 % (ref 0.0–3.0)
Eosinophils Absolute: 0.3 10*3/uL (ref 0.0–0.7)
Eosinophils Relative: 3.8 % (ref 0.0–5.0)
HCT: 41.3 % (ref 36.0–46.0)
Hemoglobin: 13.4 g/dL (ref 12.0–15.0)
Lymphocytes Relative: 27 % (ref 12.0–46.0)
Lymphs Abs: 2.1 10*3/uL (ref 0.7–4.0)
MCHC: 32.4 g/dL (ref 30.0–36.0)
MCV: 89.3 fl (ref 78.0–100.0)
Monocytes Absolute: 0.7 10*3/uL (ref 0.1–1.0)
Monocytes Relative: 9.2 % (ref 3.0–12.0)
Neutro Abs: 4.7 10*3/uL (ref 1.4–7.7)
Neutrophils Relative %: 59.2 % (ref 43.0–77.0)
Platelets: 290 10*3/uL (ref 150.0–400.0)
RBC: 4.62 Mil/uL (ref 3.87–5.11)
RDW: 13.2 % (ref 11.5–15.5)
WBC: 7.9 10*3/uL (ref 4.0–10.5)

## 2019-04-20 LAB — COMPREHENSIVE METABOLIC PANEL
ALT: 10 U/L (ref 0–35)
AST: 15 U/L (ref 0–37)
Albumin: 4.2 g/dL (ref 3.5–5.2)
Alkaline Phosphatase: 54 U/L (ref 39–117)
BUN: 13 mg/dL (ref 6–23)
CO2: 23 mEq/L (ref 19–32)
Calcium: 8.8 mg/dL (ref 8.4–10.5)
Chloride: 104 mEq/L (ref 96–112)
Creatinine, Ser: 0.83 mg/dL (ref 0.40–1.20)
GFR: 75.7 mL/min (ref >60.00)
Glucose, Bld: 84 mg/dL (ref 70–99)
Potassium: 4.5 mEq/L (ref 3.5–5.1)
Sodium: 136 mEq/L (ref 135–145)
Total Bilirubin: 0.3 mg/dL (ref 0.2–1.2)
Total Protein: 7.1 g/dL (ref 6.0–8.3)

## 2019-04-20 LAB — LIPID PANEL
Cholesterol: 160 mg/dL (ref 0–200)
HDL: 59 mg/dL (ref >39.00)
LDL Cholesterol: 85 mg/dL (ref 0–99)
NonHDL: 101.03
Total CHOL/HDL Ratio: 3
Triglycerides: 78 mg/dL (ref 0.0–149.0)
VLDL: 15.6 mg/dL (ref 0.0–40.0)

## 2019-04-20 LAB — TSH: TSH: 1.19 u[IU]/mL (ref 0.35–4.50)

## 2019-04-20 LAB — MICROALBUMIN / CREATININE URINE RATIO
Creatinine,U: 139.3 mg/dL
Microalb Creat Ratio: 4.7 mg/g (ref 0.0–30.0)
Microalb, Ur: 6.6 mg/dL — ABNORMAL HIGH (ref 0.0–1.9)

## 2019-04-23 ENCOUNTER — Other Ambulatory Visit: Payer: Self-pay

## 2019-04-23 ENCOUNTER — Ambulatory Visit (INDEPENDENT_AMBULATORY_CARE_PROVIDER_SITE_OTHER): Payer: 59 | Admitting: Family Medicine

## 2019-04-23 ENCOUNTER — Encounter: Payer: Self-pay | Admitting: Family Medicine

## 2019-04-23 VITALS — BP 106/74 | HR 91 | Temp 98.0°F | Resp 18 | Ht 65.0 in | Wt 200.6 lb

## 2019-04-23 DIAGNOSIS — I493 Ventricular premature depolarization: Secondary | ICD-10-CM

## 2019-04-23 DIAGNOSIS — Z0000 Encounter for general adult medical examination without abnormal findings: Secondary | ICD-10-CM | POA: Diagnosis not present

## 2019-04-23 DIAGNOSIS — E669 Obesity, unspecified: Secondary | ICD-10-CM | POA: Diagnosis not present

## 2019-04-23 DIAGNOSIS — I10 Essential (primary) hypertension: Secondary | ICD-10-CM | POA: Diagnosis not present

## 2019-04-23 MED ORDER — METOPROLOL TARTRATE 25 MG PO TABS: 25.0000 mg | ORAL_TABLET | Freq: Two times a day (BID) | ORAL | 3 refills | Status: DC | PRN

## 2019-04-23 NOTE — Patient Instructions (Signed)
Good to see you today Work on fluid intake. Return to meal prepping.  Return in 1 year for next physical.  Health Maintenance, Female Adopting a healthy lifestyle and getting preventive care are important in promoting health and wellness. Ask your health care provider about:  The right schedule for you to have regular tests and exams.  Things you can do on your own to prevent diseases and keep yourself healthy. What should I know about diet, weight, and exercise? Eat a healthy diet   Eat a diet that includes plenty of vegetables, fruits, low-fat dairy products, and lean protein.  Do not eat a lot of foods that are high in solid fats, added sugars, or sodium. Maintain a healthy weight Body mass index (BMI) is used to identify weight problems. It estimates body fat based on height and weight. Your health care provider can help determine your BMI and help you achieve or maintain a healthy weight. Get regular exercise Get regular exercise. This is one of the most important things you can do for your health. Most adults should:  Exercise for at least 150 minutes each week. The exercise should increase your heart rate and make you sweat (moderate-intensity exercise).  Do strengthening exercises at least twice a week. This is in addition to the moderate-intensity exercise.  Spend less time sitting. Even light physical activity can be beneficial. Watch cholesterol and blood lipids Have your blood tested for lipids and cholesterol at 41 years of age, then have this test every 5 years. Have your cholesterol levels checked more often if:  Your lipid or cholesterol levels are high.  You are older than 41 years of age.  You are at high risk for heart disease. What should I know about cancer screening? Depending on your health history and family history, you may need to have cancer screening at various ages. This may include screening for:  Breast cancer.  Cervical cancer.  Colorectal  cancer.  Skin cancer.  Lung cancer. What should I know about heart disease, diabetes, and high blood pressure? Blood pressure and heart disease  High blood pressure causes heart disease and increases the risk of stroke. This is more likely to develop in people who have high blood pressure readings, are of African descent, or are overweight.  Have your blood pressure checked: ? Every 3-5 years if you are 81-31 years of age. ? Every year if you are 4 years old or older. Diabetes Have regular diabetes screenings. This checks your fasting blood sugar level. Have the screening done:  Once every three years after age 91 if you are at a normal weight and have a low risk for diabetes.  More often and at a younger age if you are overweight or have a high risk for diabetes. What should I know about preventing infection? Hepatitis B If you have a higher risk for hepatitis B, you should be screened for this virus. Talk with your health care provider to find out if you are at risk for hepatitis B infection. Hepatitis C Testing is recommended for:  Everyone born from [birth year] through 1965.  Anyone with known risk factors for hepatitis C. Sexually transmitted infections (STIs)  Get screened for STIs, including gonorrhea and chlamydia, if: ? You are sexually active and are younger than 41 years of age. ? You are older than 41 years of age and your health care provider tells you that you are at risk for this type of infection. ? Your sexual activity has  changed since you were last screened, and you are at increased risk for chlamydia or gonorrhea. Ask your health care provider if you are at risk.  Ask your health care provider about whether you are at high risk for HIV. Your health care provider may recommend a prescription medicine to help prevent HIV infection. If you choose to take medicine to prevent HIV, you should first get tested for HIV. You should then be tested every 3 months for as long as  you are taking the medicine. Pregnancy  If you are about to stop having your period (premenopausal) and you may become pregnant, seek counseling before you get pregnant.  Take 400 to 800 micrograms (mcg) of folic acid every day if you become pregnant.  Ask for birth control (contraception) if you want to prevent pregnancy. Osteoporosis and menopause Osteoporosis is a disease in which the bones lose minerals and strength with aging. This can result in bone fractures. If you are 52 years old or older, or if you are at risk for osteoporosis and fractures, ask your health care provider if you should:  Be screened for bone loss.  Take a calcium or vitamin D supplement to lower your risk of fractures.  Be given hormone replacement therapy (HRT) to treat symptoms of menopause. Follow these instructions at home: Lifestyle  Do not use any products that contain nicotine or tobacco, such as cigarettes, e-cigarettes, and chewing tobacco. If you need help quitting, ask your health care provider.  Do not use street drugs.  Do not share needles.  Ask your health care provider for help if you need support or information about quitting drugs. Alcohol use  Do not drink alcohol if: ? Your health care provider tells you not to drink. ? You are pregnant, may be pregnant, or are planning to become pregnant.  If you drink alcohol: ? Limit how much you use to 0-1 drink a day. ? Limit intake if you are breastfeeding.  Be aware of how much alcohol is in your drink. In the [place] one drink equals one 12 oz bottle of beer (355 mL), one 5 oz glass of wine (148 mL), or one 1 oz glass of hard liquor (44 mL). General instructions  Schedule regular health, dental, and eye exams.  Stay current with your vaccines.  Tell your health care provider if: ? You often feel depressed. ? You have ever been abused or do not feel safe at home. Summary  Adopting a healthy lifestyle and getting preventive care are  important in promoting health and wellness.  Follow your health care provider's instructions about healthy diet, exercising, and getting tested or screened for diseases.  Follow your health care provider's instructions on monitoring your cholesterol and blood pressure. This information is not intended to replace advice given to you by your health care provider. Make sure you discuss any questions you have with your health care provider. Document Released: 04/01/2011 Document Revised: 09/09/2018 Document Reviewed: 09/09/2018 Elsevier Patient Education  2020 [employer].

## 2019-04-23 NOTE — Assessment & Plan Note (Signed)
Continue to encourage healthy diet and lifestyle changes to affect sustainable weight loss.  

## 2019-04-23 NOTE — Assessment & Plan Note (Signed)
Ongoing, not really using metoprolol. Will continue to monitor.

## 2019-04-23 NOTE — Progress Notes (Signed)
This visit was conducted in person.  BP 106/74   Pulse 91   Temp 98 F (36.7 C)   Resp 18   Ht 5\' 5"  (1.651 m)   Wt 200 lb 9 oz (91 kg)   LMP 04/14/2019   SpO2 99%   BMI 33.38 kg/m    CC: CPE Subjective:    Patient ID: Jasmine Dixon, female    DOB: June 10, 1976, 43 y.o.   MRN: [id number]  HPI: Hayla Hinger is a 43 y.o. female presenting on 04/23/2019 for Annual Exam   20lb weight gain with pandemic.  Planning to start kickboxing exercise program.  Step son passed away last month in MVA.   Symptomatic PVCs controlled on rare beta blockerPRN. Work up included echo and exercise tolerance test.Was able to taper off ACEI.   Completed NP school 2016.Initially worked with [employer]. 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   Preventative: Well woman at [facility] - Dr. Georgiann Cocker at [facility].Sees yearly. H/o abnl paps, removed abnl cells s/p LEEP. Normal pap last several years.  Mammogram - 04/2018 Birads1 Flu shot yearly at work Tdap - 09/2012  Seat belt use discussed. Avoids sun. No changing moles on skin.  Non smoker Alcohol - occasional  Dentist Q9mo Eye exam - due  LMP 04/14/2019, regular cycles.  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 Interim medical history since our last visit reviewed. Allergies and medications reviewed and updated. Outpatient Medications Prior to Visit  Medication Sig Dispense Refill  . metoprolol tartrate (LOPRESSOR) 25 MG tablet Take 1 tablet (25 mg total) by mouth 2 (two) times daily as needed (palpitations). (Patient not taking: Reported on 04/23/2019) 60 tablet 3   No facility-administered medications prior to  visit.      Per HPI unless specifically indicated in ROS section below Review of Systems  Constitutional: Negative for activity change, appetite change, chills, fatigue, fever and unexpected weight change.  HENT: Negative for hearing loss.   Eyes: Negative for visual disturbance.  Respiratory: Negative for cough, chest tightness, shortness of breath and wheezing.   Cardiovascular: Positive for chest pain (sharp R sided) and palpitations. Negative for leg swelling.  Gastrointestinal: Positive for abdominal pain. Negative for abdominal distention, blood in stool, constipation, diarrhea, nausea and vomiting.  Genitourinary: Negative for difficulty urinating and hematuria.  Musculoskeletal: Negative for arthralgias, myalgias and neck pain.  Skin: Negative for rash.  Neurological: Negative for dizziness, seizures, syncope and headaches.  Hematological: Negative for adenopathy. Does not bruise/bleed easily.  Psychiatric/Behavioral: Negative for dysphoric mood. The patient is not nervous/anxious.    Objective:    BP 106/74   Pulse 91   Temp 98 F (36.7 C)   Resp 18   Ht 5\' 5"  (1.651 m)   Wt 200 lb 9 oz (91 kg)   LMP 04/14/2019   SpO2 99%   BMI 33.38 kg/m   Wt Readings from Last 3 Encounters:  04/23/19 200 lb 9 oz (91 kg)  04/16/18 180 lb 8 oz (81.9 kg)  04/15/17 186 lb 12 oz (84.7 kg)    Physical Exam Vitals signs and nursing note reviewed.  Constitutional:      General: She is not in acute distress.  Appearance: Normal appearance. She is well-developed. She is not ill-appearing.  HENT:     Head: Normocephalic and atraumatic.     Right Ear: Hearing, tympanic membrane, ear canal and external ear normal.     Left Ear: Hearing, tympanic membrane, ear canal and external ear normal.     Nose: Nose normal.     Mouth/Throat:     Mouth: Mucous membranes are moist.     Pharynx: Uvula midline. No oropharyngeal exudate or posterior oropharyngeal erythema.  Eyes:     General: No  scleral icterus.    Extraocular Movements: Extraocular movements intact.     Conjunctiva/sclera: Conjunctivae normal.     Pupils: Pupils are equal, round, and reactive to light.  Neck:     Musculoskeletal: Normal range of motion and neck supple.  Cardiovascular:     Rate and Rhythm: Normal rate and regular rhythm.     Pulses: Normal pulses.          Radial pulses are 2+ on the right side and 2+ on the left side.     Heart sounds: Normal heart sounds. No murmur.  Pulmonary:     Effort: Pulmonary effort is normal. No respiratory distress.     Breath sounds: Normal breath sounds. No wheezing, rhonchi or rales.     Comments: No reproducible chest wall pain Chest:     Chest wall: No tenderness.  Abdominal:     General: Bowel sounds are normal. There is no distension.     Palpations: Abdomen is soft. There is no mass.     Tenderness: There is no abdominal tenderness. There is no guarding or rebound.     Hernia: No hernia is present.  Musculoskeletal: Normal range of motion.  Lymphadenopathy:     Cervical: No cervical adenopathy.  Skin:    General: Skin is warm and dry.     Findings: No rash.  Neurological:     General: No focal deficit present.     Mental Status: She is alert and oriented to person, place, and time.     Comments: CN grossly intact, station and gait intact  Psychiatric:        Mood and Affect: Mood normal.        Behavior: Behavior normal.        Thought Content: Thought content normal.        Judgment: Judgment normal.       Results for orders placed or performed in visit on 04/23/19  HM PAP SMEAR  Result Value Ref Range   HM Pap smear negative, HPV negative    Assessment & Plan:   Problem List Items Addressed This Visit    Symptomatic PVCs (Chronic)    Ongoing, not really using metoprolol. Will continue to monitor.       Relevant Medications   metoprolol tartrate (LOPRESSOR) 25 MG tablet   Obesity, Class I, BMI 30-34.9    Continue to encourage  healthy diet and lifestyle changes to affect sustainable weight loss.       RESOLVED: Hypertension, essential (Chronic)    Stable off medication - will resolve.       Relevant Medications   metoprolol tartrate (LOPRESSOR) 25 MG tablet   Healthcare maintenance - Primary    Preventative protocols reviewed and updated unless pt declined. Discussed healthy diet and lifestyle.           Meds ordered this encounter  Medications  . metoprolol tartrate (LOPRESSOR) 25 MG tablet  Sig: Take 1 tablet (25 mg total) by mouth 2 (two) times daily as needed (palpitations).    Dispense:  30 tablet    Refill:  3   Orders Placed This Encounter  Procedures  . HM PAP SMEAR    This external order was created through the Results Console.    Follow up plan: Return in about 1 year (around 04/22/2020) for annual exam, prior fasting for blood work.  Ria Bush, MD

## 2019-04-23 NOTE — Assessment & Plan Note (Signed)
Preventative protocols reviewed and updated unless pt declined. Discussed healthy diet and lifestyle.  

## 2019-04-23 NOTE — Assessment & Plan Note (Signed)
Stable off medication - will resolve.

## 2019-06-01 ENCOUNTER — Other Ambulatory Visit: Payer: Self-pay

## 2019-06-01 ENCOUNTER — Ambulatory Visit
Admission: RE | Admit: 2019-06-01 | Discharge: 2019-06-01 | Disposition: A | Discharge status: Home / Self Care | Payer: 59 | Source: Ambulatory Visit | Attending: Family Medicine | Admitting: Family Medicine

## 2019-06-01 DIAGNOSIS — Z1231 Encounter for screening mammogram for malignant neoplasm of breast: Secondary | ICD-10-CM

## 2019-06-01 LAB — HM MAMMOGRAPHY

## 2019-06-03 ENCOUNTER — Encounter: Payer: Self-pay | Admitting: Family Medicine

## 2020-04-24 ENCOUNTER — Other Ambulatory Visit: Payer: Self-pay | Admitting: Family Medicine

## 2020-04-24 DIAGNOSIS — E669 Obesity, unspecified: Secondary | ICD-10-CM

## 2020-04-25 ENCOUNTER — Other Ambulatory Visit: Payer: Self-pay

## 2020-04-25 ENCOUNTER — Other Ambulatory Visit (INDEPENDENT_AMBULATORY_CARE_PROVIDER_SITE_OTHER): Payer: 59

## 2020-04-25 DIAGNOSIS — E669 Obesity, unspecified: Secondary | ICD-10-CM | POA: Diagnosis not present

## 2020-04-25 LAB — LIPID PANEL
Cholesterol: 151 mg/dL (ref 0–200)
HDL: 56.3 mg/dL (ref >39.00)
LDL Cholesterol: 85 mg/dL (ref 0–99)
NonHDL: 94.78
Total CHOL/HDL Ratio: 3
Triglycerides: 50 mg/dL (ref 0.0–149.0)
VLDL: 10 mg/dL (ref 0.0–40.0)

## 2020-04-25 LAB — BASIC METABOLIC PANEL
BUN: 9 mg/dL (ref 6–23)
CO2: 25 mEq/L (ref 19–32)
Calcium: 9.2 mg/dL (ref 8.4–10.5)
Chloride: 104 mEq/L (ref 96–112)
Creatinine, Ser: 0.79 mg/dL (ref 0.40–1.20)
GFR: 96.49 mL/min (ref >60.00)
Glucose, Bld: 84 mg/dL (ref 70–99)
Potassium: 4.1 mEq/L (ref 3.5–5.1)
Sodium: 135 mEq/L (ref 135–145)

## 2020-04-25 LAB — CBC WITH DIFFERENTIAL/PLATELET
Basophils Absolute: 0 10*3/uL (ref 0.0–0.1)
Basophils Relative: 0.6 % (ref 0.0–3.0)
Eosinophils Absolute: 0.3 10*3/uL (ref 0.0–0.7)
Eosinophils Relative: 4.4 % (ref 0.0–5.0)
HCT: 38.7 % (ref 36.0–46.0)
Hemoglobin: 12.9 g/dL (ref 12.0–15.0)
Lymphocytes Relative: 24.9 % (ref 12.0–46.0)
Lymphs Abs: 1.6 10*3/uL (ref 0.7–4.0)
MCHC: 33.3 g/dL (ref 30.0–36.0)
MCV: 87.5 fl (ref 78.0–100.0)
Monocytes Absolute: 0.6 10*3/uL (ref 0.1–1.0)
Monocytes Relative: 9.6 % (ref 3.0–12.0)
Neutro Abs: 4 10*3/uL (ref 1.4–7.7)
Neutrophils Relative %: 60.5 % (ref 43.0–77.0)
Platelets: 272 10*3/uL (ref 150.0–400.0)
RBC: 4.43 Mil/uL (ref 3.87–5.11)
RDW: 13.3 % (ref 11.5–15.5)
WBC: 6.5 10*3/uL (ref 4.0–10.5)

## 2020-04-25 LAB — TSH: TSH: 1.56 u[IU]/mL (ref 0.35–4.50)

## 2020-04-28 ENCOUNTER — Other Ambulatory Visit: Payer: Self-pay

## 2020-04-28 ENCOUNTER — Encounter: Payer: Self-pay | Admitting: Family Medicine

## 2020-04-28 ENCOUNTER — Ambulatory Visit (INDEPENDENT_AMBULATORY_CARE_PROVIDER_SITE_OTHER): Payer: 59 | Admitting: Family Medicine

## 2020-04-28 VITALS — BP 128/90 | HR 80 | Temp 97.4°F | Ht 64.5 in | Wt 198.5 lb

## 2020-04-28 DIAGNOSIS — N96 Recurrent pregnancy loss: Secondary | ICD-10-CM | POA: Insufficient documentation

## 2020-04-28 DIAGNOSIS — Z0001 Encounter for general adult medical examination with abnormal findings: Secondary | ICD-10-CM | POA: Diagnosis not present

## 2020-04-28 DIAGNOSIS — R202 Paresthesia of skin: Secondary | ICD-10-CM | POA: Insufficient documentation

## 2020-04-28 DIAGNOSIS — R030 Elevated blood-pressure reading, without diagnosis of hypertension: Secondary | ICD-10-CM | POA: Insufficient documentation

## 2020-04-28 DIAGNOSIS — E669 Obesity, unspecified: Secondary | ICD-10-CM | POA: Diagnosis not present

## 2020-04-28 DIAGNOSIS — Z8759 Personal history of other complications of pregnancy, childbirth and the puerperium: Secondary | ICD-10-CM | POA: Insufficient documentation

## 2020-04-28 LAB — VITAMIN B12: Vitamin B-12: 396 pg/mL (ref 211–911)

## 2020-04-28 NOTE — Assessment & Plan Note (Signed)
Appreciate OB care.

## 2020-04-28 NOTE — Progress Notes (Signed)
This visit was conducted in person.  BP (!) 128/90   Pulse 80   Temp (!) 97.4 F (36.3 C) (Temporal)   Ht 5' 4.5" (1.638 m)   Wt 198 lb 8 oz (90 kg)   SpO2 99%   BMI 33.55 kg/m   No exam data present  140/100 on recheck  CC: CPE Subjective:    Patient ID: Jasmine Dixon, female    DOB: 12-01-1975, 44 y.o.   MRN: [id number]  HPI: Jasmine Dixon is a 44 y.o. female presenting on 04/28/2020 for Annual Exam and Numbness (both feet on and off x 6 months)   Recently married!  + preg test 3 wks ago - no IUP - ?ectopic or miscarriage - planned serial hCGs. Closely followed by OBGYN.   Symptomatic PVCs controlled onrarebeta blockerPRN.Work up included echo and exercise tolerance test.  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   Notes some paresthesias to feet for 6+ months. Notes dusky feet with prolonged sitting. No numbness, burning pain, claudication.  PAD screening abnormal at work.   Preventative: Well woman at [facility] Sees yearly. H/o abnl paps, removed abnl cells s/p LEEP. Normalpap last 3 years.  Mammogram - 06/2019 Birads1  Flu shot yearly at work COVID vaccine [employer] 11/2019 Tdap - 09/2012  Seat belt use discussed. Avoids sun. No changing moles on skin.  Non smoker  Alcohol - social  Dentist Q3-49mo Eye exam - due  LMP early 02/2020, regular cycles.  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 Interim medical history since our last visit reviewed. Allergies and medications reviewed and updated. Outpatient Medications Prior to Visit  Medication Sig Dispense Refill  . metoprolol tartrate (LOPRESSOR) 25 MG tablet Take 1 tablet (25 mg  total) by mouth 2 (two) times daily as needed (palpitations). 30 tablet 3   No facility-administered medications prior to visit.     Per HPI unless specifically indicated in ROS section below Review of Systems  Constitutional: Negative for activity change, appetite change, chills, fatigue, fever and unexpected weight change.  HENT: Negative for hearing loss.   Eyes: Negative for visual disturbance.  Respiratory: Negative for cough, chest tightness, shortness of breath and wheezing.   Cardiovascular: Negative for chest pain, palpitations and leg swelling.  Gastrointestinal: Negative for abdominal distention, abdominal pain, blood in stool, constipation, diarrhea, nausea and vomiting.  Genitourinary: Negative for difficulty urinating and hematuria.  Musculoskeletal: Negative for arthralgias, myalgias and neck pain.       Foot paresthesias  Skin: Negative for rash.  Neurological: Negative for dizziness, seizures, syncope and headaches.  Hematological: Negative for adenopathy. Does not bruise/bleed easily.  Psychiatric/Behavioral: Negative for dysphoric mood. The patient is not nervous/anxious.    Objective:  BP (!) 128/90   Pulse 80   Temp (!) 97.4 F (36.3 C) (Temporal)   Ht 5' 4.5" (1.638 m)   Wt 198 lb 8 oz (90 kg)   SpO2 99%   BMI 33.55 kg/m   Wt Readings from Last 3 Encounters:  04/28/20 198 lb 8 oz (90 kg)  04/23/19 200 lb 9 oz (91 kg)  04/16/18 180 lb 8 oz (81.9 kg)      Physical Exam Vitals and nursing note reviewed.  Constitutional:      General: She is not in acute distress.    Appearance: Normal appearance. She is well-developed. She is not ill-appearing.  HENT:     Head: Normocephalic and atraumatic.     Right Ear: Hearing, tympanic membrane, ear canal and external ear normal.     Left Ear: Hearing, tympanic membrane, ear canal and external ear normal.  Eyes:     General: No scleral icterus.    Extraocular Movements: Extraocular movements intact.      Conjunctiva/sclera: Conjunctivae normal.     Pupils: Pupils are equal, round, and reactive to light.  Neck:     Thyroid: No thyroid mass, thyromegaly or thyroid tenderness.  Cardiovascular:     Rate and Rhythm: Normal rate and regular rhythm.     Pulses: Normal pulses.          Radial pulses are 2+ on the right side and 2+ on the left side.     Heart sounds: Normal heart sounds. No murmur heard.   Pulmonary:     Effort: Pulmonary effort is normal. No respiratory distress.     Breath sounds: Normal breath sounds. No wheezing, rhonchi or rales.  Abdominal:     General: Abdomen is flat. Bowel sounds are normal. There is no distension.     Palpations: Abdomen is soft. There is no mass.     Tenderness: There is no abdominal tenderness. There is no guarding or rebound.     Hernia: No hernia is present.  Musculoskeletal:        General: Normal range of motion.     Cervical back: Normal range of motion and neck supple.     Right lower leg: No edema.     Left lower leg: No edema.     Comments:  2+ DP bilaterally Sensation intact to light touch and monofilament testing No pedal edema   Lymphadenopathy:     Cervical: No cervical adenopathy.  Skin:    General: Skin is warm and dry.     Findings: No rash.  Neurological:     General: No focal deficit present.     Mental Status: She is alert and oriented to person, place, and time.     Comments: CN grossly intact, station and gait intact  Psychiatric:        Mood and Affect: Mood normal.        Behavior: Behavior normal.        Thought Content: Thought content normal.        Judgment: Judgment normal.       Results for orders placed or performed in visit on 04/25/20  TSH  Result Value Ref Range   TSH 1.56 0.35 - 4.50 uIU/mL  CBC with Differential/Platelet  Result Value Ref Range   WBC 6.5 4.0 - 10.5 K/uL   RBC 4.43 3.87 - 5.11 Mil/uL   Hemoglobin 12.9 12.0 - 15.0 g/dL   HCT 38.7 36 - 46 %   MCV 87.5 78.0 - 100.0 fl   MCHC  33.3 30.0 - 36.0 g/dL   RDW 13.3 11.5 - 15.5 %   Platelets 272.0 150 - 400 K/uL   Neutrophils Relative % 60.5 43 - 77 %   Lymphocytes Relative 24.9 12 - 46 %   Monocytes Relative 9.6 3 - 12 %   Eosinophils Relative 4.4 0 - 5 %   Basophils Relative 0.6 0 - 3 %   Neutro Abs 4.0 1.4 - 7.7 K/uL  Lymphs Abs 1.6 0.7 - 4.0 K/uL   Monocytes Absolute 0.6 0 - 1 K/uL   Eosinophils Absolute 0.3 0 - 0 K/uL   Basophils Absolute 0.0 0 - 0 K/uL  Basic metabolic panel  Result Value Ref Range   Sodium 135 135 - 145 mEq/L   Potassium 4.1 3.5 - 5.1 mEq/L   Chloride 104 96 - 112 mEq/L   CO2 25 19 - 32 mEq/L   Glucose, Bld 84 70 - 99 mg/dL   BUN 9 6 - 23 mg/dL   Creatinine, Ser 0.79 0.40 - 1.20 mg/dL   GFR 96.49 >60.00 mL/min   Calcium 9.2 8.4 - 10.5 mg/dL  Lipid panel  Result Value Ref Range   Cholesterol 151 0 - 200 mg/dL   Triglycerides 50.0 0 - 149 mg/dL   HDL 56.30 >39.00 mg/dL   VLDL 10.0 0.0 - 40.0 mg/dL   LDL Cholesterol 85 0 - 99 mg/dL   Total CHOL/HDL Ratio 3    NonHDL 94.78    Assessment & Plan:  This visit occurred during the SARS-CoV-2 public health emergency.  Safety protocols were in place, including screening questions prior to the visit, additional usage of staff PPE, and extensive cleaning of exam room while observing appropriate contact time as indicated for disinfecting solutions.   Problem List Items Addressed This Visit    Paresthesia of both feet    Benign exam. Check B12.       Relevant Orders   Vitamin B12   Obesity, Class I, BMI 30-34.9    Encouraged healthy diet and lifestyle choices to affect sustainable weight loss.       Miscarriage within last 12 months    Appreciate OB care.       Encounter for general adult medical examination with abnormal findings - Primary    Preventative protocols reviewed and updated unless pt declined. Discussed healthy diet and lifestyle.       Elevated blood pressure reading in office without diagnosis of hypertension     BP elevated today. Reviewed healthy diet and lifestyle choices to improve blood pressure control. She will start monitoring at home and let me know if consistently elevated to start medication - likely labetalol or metoprolol as they are attempting to conceive.           No orders of the defined types were placed in this encounter.  Orders Placed This Encounter  Procedures  . Vitamin B12   Patient instructions: Start monitoring blood pressures at home - let me know if consistently >140/90 to start blood pressure medication.  Work on aerobic exercise routine, limit salt, increase water, increase potassium rich foods and fiber.  B12 level today.  You are doing well today. Good to see you Return as needed or in 1 year for next physical.   Follow up plan: Return in about 1 year (around 04/28/2021) for annual exam, prior fasting for blood work.  Ria Bush, MD

## 2020-04-28 NOTE — Assessment & Plan Note (Signed)
Benign exam. Check B12.

## 2020-04-28 NOTE — Assessment & Plan Note (Signed)
Encouraged healthy diet and lifestyle choices to affect sustainable weight loss.  ?

## 2020-04-28 NOTE — Assessment & Plan Note (Signed)
Preventative protocols reviewed and updated unless pt declined. Discussed healthy diet and lifestyle.  

## 2020-04-28 NOTE — Assessment & Plan Note (Signed)
BP elevated today. Reviewed healthy diet and lifestyle choices to improve blood pressure control. She will start monitoring at home and let me know if consistently elevated to start medication - likely labetalol or metoprolol as they are attempting to conceive.

## 2020-04-28 NOTE — Patient Instructions (Signed)
Start monitoring blood pressures at home - let me know if consistently >140/90 to start blood pressure medication.  Work on aerobic exercise routine, limit salt, increase water, increase potassium rich foods and fiber.  B12 level today.  You are doing well today. Good to see you Return as needed or in 1 year for next physical.   Health Maintenance, Female Adopting a healthy lifestyle and getting preventive care are important in promoting health and wellness. Ask your health care provider about:  The right schedule for you to have regular tests and exams.  Things you can do on your own to prevent diseases and keep yourself healthy. What should I know about diet, weight, and exercise? Eat a healthy diet   Eat a diet that includes plenty of vegetables, fruits, low-fat dairy products, and lean protein.  Do not eat a lot of foods that are high in solid fats, added sugars, or sodium. Maintain a healthy weight Body mass index (BMI) is used to identify weight problems. It estimates body fat based on height and weight. Your health care provider can help determine your BMI and help you achieve or maintain a healthy weight. Get regular exercise Get regular exercise. This is one of the most important things you can do for your health. Most adults should:  Exercise for at least 150 minutes each week. The exercise should increase your heart rate and make you sweat (moderate-intensity exercise).  Do strengthening exercises at least twice a week. This is in addition to the moderate-intensity exercise.  Spend less time sitting. Even light physical activity can be beneficial. Watch cholesterol and blood lipids Have your blood tested for lipids and cholesterol at 42 years of age, then have this test every 5 years. Have your cholesterol levels checked more often if:  Your lipid or cholesterol levels are high.  You are older than 42 years of age.  You are at high risk for heart disease. What should  I know about cancer screening? Depending on your health history and family history, you may need to have cancer screening at various ages. This may include screening for:  Breast cancer.  Cervical cancer.  Colorectal cancer.  Skin cancer.  Lung cancer. What should I know about heart disease, diabetes, and high blood pressure? Blood pressure and heart disease  High blood pressure causes heart disease and increases the risk of stroke. This is more likely to develop in people who have high blood pressure readings, are of African descent, or are overweight.  Have your blood pressure checked: ? Every 3-5 years if you are 74-3 years of age. ? Every year if you are 44 years old or older. Diabetes Have regular diabetes screenings. This checks your fasting blood sugar level. Have the screening done:  Once every three years after age 47 if you are at a normal weight and have a low risk for diabetes.  More often and at a younger age if you are overweight or have a high risk for diabetes. What should I know about preventing infection? Hepatitis B If you have a higher risk for hepatitis B, you should be screened for this virus. Talk with your health care provider to find out if you are at risk for hepatitis B infection. Hepatitis C Testing is recommended for:  Everyone born from [birth year] through 1965.  Anyone with known risk factors for hepatitis C. Sexually transmitted infections (STIs)  Get screened for STIs, including gonorrhea and chlamydia, if: ? You are sexually active and  are younger than 42 years of age. ? You are older than 42 years of age and your health care provider tells you that you are at risk for this type of infection. ? Your sexual activity has changed since you were last screened, and you are at increased risk for chlamydia or gonorrhea. Ask your health care provider if you are at risk.  Ask your health care provider about whether you are at high risk for HIV. Your health  care provider may recommend a prescription medicine to help prevent HIV infection. If you choose to take medicine to prevent HIV, you should first get tested for HIV. You should then be tested every 3 months for as long as you are taking the medicine. Pregnancy  If you are about to stop having your period (premenopausal) and you may become pregnant, seek counseling before you get pregnant.  Take 400 to 800 micrograms (mcg) of folic acid every day if you become pregnant.  Ask for birth control (contraception) if you want to prevent pregnancy. Osteoporosis and menopause Osteoporosis is a disease in which the bones lose minerals and strength with aging. This can result in bone fractures. If you are 64 years old or older, or if you are at risk for osteoporosis and fractures, ask your health care provider if you should:  Be screened for bone loss.  Take a calcium or vitamin D supplement to lower your risk of fractures.  Be given hormone replacement therapy (HRT) to treat symptoms of menopause. Follow these instructions at home: Lifestyle  Do not use any products that contain nicotine or tobacco, such as cigarettes, e-cigarettes, and chewing tobacco. If you need help quitting, ask your health care provider.  Do not use street drugs.  Do not share needles.  Ask your health care provider for help if you need support or information about quitting drugs. Alcohol use  Do not drink alcohol if: ? Your health care provider tells you not to drink. ? You are pregnant, may be pregnant, or are planning to become pregnant.  If you drink alcohol: ? Limit how much you use to 0-1 drink a day. ? Limit intake if you are breastfeeding.  Be aware of how much alcohol is in your drink. In the [place] one drink equals one 12 oz bottle of beer (355 mL), one 5 oz glass of wine (148 mL), or one 1 oz glass of hard liquor (44 mL). General instructions  Schedule regular health, dental, and eye exams.  Stay  current with your vaccines.  Tell your health care provider if: ? You often feel depressed. ? You have ever been abused or do not feel safe at home. Summary  Adopting a healthy lifestyle and getting preventive care are important in promoting health and wellness.  Follow your health care provider's instructions about healthy diet, exercising, and getting tested or screened for diseases.  Follow your health care provider's instructions on monitoring your cholesterol and blood pressure. This information is not intended to replace advice given to you by your health care provider. Make sure you discuss any questions you have with your health care provider. Document Revised: 09/09/2018 Document Reviewed: 09/09/2018 Elsevier Patient Education  2020 [employer].

## 2020-05-04 ENCOUNTER — Other Ambulatory Visit: Payer: Self-pay | Admitting: Family Medicine

## 2020-05-04 DIAGNOSIS — Z1231 Encounter for screening mammogram for malignant neoplasm of breast: Secondary | ICD-10-CM

## 2020-05-31 LAB — HM PAP SMEAR: HM Pap smear: NEGATIVE

## 2020-06-01 ENCOUNTER — Other Ambulatory Visit: Payer: Self-pay

## 2020-06-01 ENCOUNTER — Ambulatory Visit
Admission: RE | Admit: 2020-06-01 | Discharge: 2020-06-01 | Disposition: A | Discharge status: Home / Self Care | Payer: 59 | Source: Ambulatory Visit | Attending: Family Medicine | Admitting: Family Medicine

## 2020-06-01 DIAGNOSIS — Z1231 Encounter for screening mammogram for malignant neoplasm of breast: Secondary | ICD-10-CM

## 2020-06-06 LAB — HM PAP SMEAR: HPV, high-risk: NEGATIVE

## 2020-06-07 ENCOUNTER — Other Ambulatory Visit: Payer: Self-pay | Admitting: Family Medicine

## 2020-06-07 DIAGNOSIS — R928 Other abnormal and inconclusive findings on diagnostic imaging of breast: Secondary | ICD-10-CM

## 2020-06-13 ENCOUNTER — Ambulatory Visit
Admission: RE | Admit: 2020-06-13 | Discharge: 2020-06-13 | Disposition: A | Discharge status: Home / Self Care | Payer: 59 | Source: Ambulatory Visit | Attending: Family Medicine | Admitting: Family Medicine

## 2020-06-13 ENCOUNTER — Other Ambulatory Visit: Payer: Self-pay

## 2020-06-13 DIAGNOSIS — R928 Other abnormal and inconclusive findings on diagnostic imaging of breast: Secondary | ICD-10-CM

## 2021-04-29 ENCOUNTER — Other Ambulatory Visit: Payer: Self-pay | Admitting: Family Medicine

## 2021-04-29 DIAGNOSIS — Z1159 Encounter for screening for other viral diseases: Secondary | ICD-10-CM

## 2021-04-29 DIAGNOSIS — E669 Obesity, unspecified: Secondary | ICD-10-CM

## 2021-04-30 ENCOUNTER — Other Ambulatory Visit: Payer: 59

## 2021-05-02 ENCOUNTER — Other Ambulatory Visit: Payer: Self-pay

## 2021-05-02 ENCOUNTER — Other Ambulatory Visit (INDEPENDENT_AMBULATORY_CARE_PROVIDER_SITE_OTHER): Payer: 59

## 2021-05-02 DIAGNOSIS — E669 Obesity, unspecified: Secondary | ICD-10-CM

## 2021-05-02 DIAGNOSIS — Z1159 Encounter for screening for other viral diseases: Secondary | ICD-10-CM

## 2021-05-02 LAB — COMPREHENSIVE METABOLIC PANEL
ALT: 17 U/L (ref 0–35)
AST: 17 U/L (ref 0–37)
Albumin: 4.2 g/dL (ref 3.5–5.2)
Alkaline Phosphatase: 53 U/L (ref 39–117)
BUN: 12 mg/dL (ref 6–23)
CO2: 21 mEq/L (ref 19–32)
Calcium: 9 mg/dL (ref 8.4–10.5)
Chloride: 104 mEq/L (ref 96–112)
Creatinine, Ser: 0.75 mg/dL (ref 0.40–1.20)
GFR: 97.67 mL/min (ref >60.00)
Glucose, Bld: 83 mg/dL (ref 70–99)
Potassium: 3.9 mEq/L (ref 3.5–5.1)
Sodium: 134 mEq/L — ABNORMAL LOW (ref 135–145)
Total Bilirubin: 0.6 mg/dL (ref 0.2–1.2)
Total Protein: 7 g/dL (ref 6.0–8.3)

## 2021-05-02 LAB — LIPID PANEL
Cholesterol: 143 mg/dL (ref 0–200)
HDL: 59.8 mg/dL (ref >39.00)
LDL Cholesterol: 73 mg/dL (ref 0–99)
NonHDL: 82.71
Total CHOL/HDL Ratio: 2
Triglycerides: 48 mg/dL (ref 0.0–149.0)
VLDL: 9.6 mg/dL (ref 0.0–40.0)

## 2021-05-02 LAB — TSH: TSH: 1.41 u[IU]/mL (ref 0.35–5.50)

## 2021-05-03 LAB — HEPATITIS C ANTIBODY
Hepatitis C Ab: NONREACTIVE
SIGNAL TO CUT-OFF: 0.11 (ref <1.00)

## 2021-05-04 ENCOUNTER — Other Ambulatory Visit: Payer: Self-pay

## 2021-05-04 ENCOUNTER — Encounter: Payer: Self-pay | Admitting: Family Medicine

## 2021-05-04 ENCOUNTER — Ambulatory Visit (INDEPENDENT_AMBULATORY_CARE_PROVIDER_SITE_OTHER): Payer: 59 | Admitting: Family Medicine

## 2021-05-04 VITALS — BP 130/80 | HR 85 | Temp 98.1°F | Ht 64.5 in | Wt 190.3 lb

## 2021-05-04 DIAGNOSIS — Z0000 Encounter for general adult medical examination without abnormal findings: Secondary | ICD-10-CM | POA: Diagnosis not present

## 2021-05-04 DIAGNOSIS — N912 Amenorrhea, unspecified: Secondary | ICD-10-CM | POA: Insufficient documentation

## 2021-05-04 DIAGNOSIS — M25561 Pain in right knee: Secondary | ICD-10-CM | POA: Insufficient documentation

## 2021-05-04 DIAGNOSIS — M222X1 Patellofemoral disorders, right knee: Secondary | ICD-10-CM | POA: Insufficient documentation

## 2021-05-04 DIAGNOSIS — E669 Obesity, unspecified: Secondary | ICD-10-CM | POA: Diagnosis not present

## 2021-05-04 LAB — HCG, QUANTITATIVE, PREGNANCY: Quantitative HCG: 4312 m[IU]/mL

## 2021-05-04 NOTE — Assessment & Plan Note (Signed)
LMP 03/19/2021. She has been trying to conceive for over a year. Continues prenatal vitamin. upcoming OBGYN appt in 10 days. No new abd pain, vaginal bleeding. In h/o miscarriage will check serum hCG per pt request.

## 2021-05-04 NOTE — Assessment & Plan Note (Signed)
Preventative protocols reviewed and updated unless pt declined. Discussed healthy diet and lifestyle.  

## 2021-05-04 NOTE — Assessment & Plan Note (Signed)
Marked crepitus of R anterior knee at patellofemoral compartment ?chondromalacia patella. Not currently significantly bothering patient - will watch for now. Discussed xray and possible ortho eval as next steps.

## 2021-05-04 NOTE — Assessment & Plan Note (Signed)
Encouraged healthy diet and lifestyle choices to affect sustainable weight loss.  ?

## 2021-05-04 NOTE — Progress Notes (Signed)
Patient ID: Jasmine Dixon, female    DOB: 1975/12/03, 45 y.o.   MRN: [id number]  This visit was conducted in person.  BP 130/80   Pulse 85   Temp 98.1 F (36.7 C) (Temporal)   Ht 5' 4.5" (1.638 m)   Wt 190 lb 5 oz (86.3 kg)   LMP 03/19/2021   SpO2 98%   BMI 32.16 kg/m    CC: CPE Subjective:   HPI: Jasmine Dixon is a 45 y.o. female presenting on 05/04/2021 for Annual Exam   Symptomatic PVCs controlled on rare beta blocker PRN. Work up included echo and exercise tolerance test. No recent need for BB.    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    Upcoming well woman with Dr Philis Pique - LMP 6/20. Upreg positive. She is taking prenatal vitamin.   Some R knee pain and R shoulder pain without inciting trauma/injury. Stays tight to R shoulder area.   Easy photosensitivity.   Preventative: Well woman at [facility] [place]). Sees yearly. H/o abnl paps, removed abnl cells s/p remote LEEP. Normal pap since then. [facility] - [facility]2020 [facility]  Flu shot yearly at work [place] 11/2019 x2, doesn't think as had booster Tdap - 09/2012  Seat belt use discussed.  Avoids sun. No changing moles on skin. Non smoker Alcohol - social  Dentist Q37moEye exam - yearly LMP early 03/2019, regular cycles.   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 Interim medical history since our last visit reviewed. Allergies and medications reviewed and updated. Outpatient Medications Prior to Visit  Medication Sig Dispense Refill   metoprolol tartrate (LOPRESSOR) 25 MG tablet Take 1 tablet (25 mg total) by mouth 2 (two) times daily as needed (palpitations).  (Patient not taking: Reported on 05/04/2021) 30 tablet 3   No facility-administered medications prior to visit.     Per HPI unless specifically indicated in ROS section below Review of Systems  Constitutional:  Negative for activity change, appetite change, chills, fatigue, fever and unexpected weight change.  HENT:  Negative for hearing loss.   Eyes:  Negative for visual disturbance.  Respiratory:  Negative for cough, chest tightness, shortness of breath and wheezing.   Cardiovascular:  Negative for chest pain, palpitations and leg swelling.  Gastrointestinal:  Negative for abdominal distention, abdominal pain, blood in stool, constipation, diarrhea, nausea and vomiting.  Genitourinary:  Negative for difficulty urinating and hematuria.  Musculoskeletal:  Negative for arthralgias, myalgias and neck pain.  Skin:  Negative for rash.  Neurological:  Negative for dizziness, seizures, syncope and headaches.  Hematological:  Negative for adenopathy. Does not bruise/bleed easily.  Psychiatric/Behavioral:  Negative for dysphoric mood. The patient is not nervous/anxious.    Objective:  BP 130/80   Pulse 85   Temp 98.1 F (36.7 C) (Temporal)   Ht 5' 4.5" (1.638 m)   Wt 190 lb 5 oz (86.3 kg)   LMP 03/19/2021   SpO2 98%   BMI 32.16 kg/m   Wt Readings from Last 3 Encounters:  05/04/21 190 lb 5 oz (86.3 kg)  04/28/20 198 lb 8 oz (90 kg)  04/23/19 200 lb 9 oz (  91 kg)      Physical Exam Vitals and nursing note reviewed.  Constitutional:      Appearance: Normal appearance. She is not ill-appearing.  HENT:     Head: Normocephalic and atraumatic.     Right Ear: Tympanic membrane, ear canal and external ear normal. There is no impacted cerumen.     Left Ear: Tympanic membrane, ear canal and external ear normal. There is no impacted cerumen.  Eyes:     General:        Right eye: No discharge.        Left eye: No discharge.     Extraocular Movements: Extraocular movements intact.      Conjunctiva/sclera: Conjunctivae normal.     Pupils: Pupils are equal, round, and reactive to light.  Neck:     Thyroid: No thyroid mass or thyromegaly.  Cardiovascular:     Rate and Rhythm: Normal rate and regular rhythm.     Pulses: Normal pulses.     Heart sounds: Normal heart sounds. No murmur heard. Pulmonary:     Effort: Pulmonary effort is normal. No respiratory distress.     Breath sounds: Normal breath sounds. No wheezing, rhonchi or rales.  Abdominal:     General: Bowel sounds are normal. There is no distension.     Palpations: Abdomen is soft. There is no mass.     Tenderness: There is no abdominal tenderness. There is no guarding or rebound.     Hernia: No hernia is present.  Musculoskeletal:     Cervical back: Normal range of motion and neck supple. No rigidity.     Right lower leg: No edema.     Left lower leg: No edema.  Lymphadenopathy:     Cervical: No cervical adenopathy.  Skin:    General: Skin is warm and dry.     Findings: No rash.  Neurological:     General: No focal deficit present.     Mental Status: She is alert. Mental status is at baseline.  Psychiatric:        Mood and Affect: Mood normal.        Behavior: Behavior normal.      Results for orders placed or performed in visit on 05/02/21  Hepatitis C antibody  Result Value Ref Range   Hepatitis C Ab NON-REACTIVE NON-REACTIVE   SIGNAL TO CUT-OFF 0.11 <1.00  TSH  Result Value Ref Range   TSH 1.41 0.35 - 5.50 uIU/mL  Lipid panel  Result Value Ref Range   Cholesterol 143 0 - 200 mg/dL   Triglycerides 48.0 0.0 - 149.0 mg/dL   HDL 59.80 >39.00 mg/dL   VLDL 9.6 0.0 - 40.0 mg/dL   LDL Cholesterol 73 0 - 99 mg/dL   Total CHOL/HDL Ratio 2    NonHDL 82.71   Comprehensive metabolic panel  Result Value Ref Range   Sodium 134 (L) 135 - 145 mEq/L   Potassium 3.9 3.5 - 5.1 mEq/L   Chloride 104 96 - 112 mEq/L   CO2 21 19 - 32 mEq/L   Glucose, Bld 83 70 - 99 mg/dL   BUN 12 6 - 23 mg/dL   Creatinine,  Ser 0.75 0.40 - 1.20 mg/dL   Total Bilirubin 0.6 0.2 - 1.2 mg/dL   Alkaline Phosphatase 53 39 - 117 U/L   AST 17 0 - 37 U/L   ALT 17 0 - 35 U/L   Total Protein 7.0 6.0 - 8.3 g/dL   Albumin 4.2 3.5 -  5.2 g/dL   GFR 97.67 >60.00 mL/min   Calcium 9.0 8.4 - 10.5 mg/dL   Lab Results  Component Value Date   VITAMINB12 396 04/28/2020    Assessment & Plan:  This visit occurred during the SARS-CoV-2 public health emergency.  Safety protocols were in place, including screening questions prior to the visit, additional usage of staff PPE, and extensive cleaning of exam room while observing appropriate contact time as indicated for disinfecting solutions.   Problem List Items Addressed This Visit     Obesity, Class I, BMI 30-34.9    Encouraged healthy diet and lifestyle choices to affect sustainable weight loss.        Health maintenance examination - Primary    Preventative protocols reviewed and updated unless pt declined. Discussed healthy diet and lifestyle.        Amenorrhea    LMP 03/19/2021. She has been trying to conceive for over a year. Continues prenatal vitamin. upcoming OBGYN appt in 10 days. No new abd pain, vaginal bleeding. In h/o miscarriage will check serum hCG per pt request.       Relevant Orders   hCG, quantitative, pregnancy   Anterior knee pain, right    Marked crepitus of R anterior knee at patellofemoral compartment ?chondromalacia patella. Not currently significantly bothering patient - will watch for now. Discussed xray and possible ortho eval as next steps.          No orders of the defined types were placed in this encounter.  Orders Placed This Encounter  Procedures   hCG, quantitative, pregnancy    Patient instructions: You are doing well today  Return as needed or in 1 year for next physical.  We can watch right knee for now, let me know if pain or other symptoms develop.  Follow up plan: Return in about 1 year (around 05/04/2022) for annual  exam, prior fasting for blood work.  Ria Bush, MD

## 2021-05-04 NOTE — Patient Instructions (Addendum)
You are doing well today  Return as needed or in 1 year for next physical.  We can watch right knee for now, let me know if pain or other symptoms develop.   Health Maintenance, Female Adopting a healthy lifestyle and getting preventive care are important in promoting health and wellness. Ask your health care provider about: The right schedule for you to have regular tests and exams. Things you can do on your own to prevent diseases and keep yourself healthy. What should I know about diet, weight, and exercise? Eat a healthy diet  Eat a diet that includes plenty of vegetables, fruits, low-fat dairy products, and lean protein. Do not eat a lot of foods that are high in solid fats, added sugars, or sodium.  Maintain a healthy weight Body mass index (BMI) is used to identify weight problems. It estimates body fat based on height and weight. Your health care provider can help determineyour BMI and help you achieve or maintain a healthy weight. Get regular exercise Get regular exercise. This is one of the most important things you can do for your health. Most adults should: Exercise for at least 150 minutes each week. The exercise should increase your heart rate and make you sweat (moderate-intensity exercise). Do strengthening exercises at least twice a week. This is in addition to the moderate-intensity exercise. Spend less time sitting. Even light physical activity can be beneficial. Watch cholesterol and blood lipids Have your blood tested for lipids and cholesterol at 43 years of age, then havethis test every 5 years. Have your cholesterol levels checked more often if: Your lipid or cholesterol levels are high. You are older than 43 years of age. You are at high risk for heart disease. What should I know about cancer screening? Depending on your health history and family history, you may need to have cancer screening at various ages. This may include screening for: Breast  cancer. Cervical cancer. Colorectal cancer. Skin cancer. Lung cancer. What should I know about heart disease, diabetes, and high blood pressure? Blood pressure and heart disease High blood pressure causes heart disease and increases the risk of stroke. This is more likely to develop in people who have high blood pressure readings, are of African descent, or are overweight. Have your blood pressure checked: Every 3-5 years if you are 71-9 years of age. Every year if you are 16 years old or older. Diabetes Have regular diabetes screenings. This checks your fasting blood sugar level. Have the screening done: Once every three years after age 57 if you are at a normal weight and have a low risk for diabetes. More often and at a younger age if you are overweight or have a high risk for diabetes. What should I know about preventing infection? Hepatitis B If you have a higher risk for hepatitis B, you should be screened for this virus. Talk with your health care provider to find out if you are at risk forhepatitis B infection. Hepatitis C Testing is recommended for: Everyone born from [birth year] through 1965. Anyone with known risk factors for hepatitis C. Sexually transmitted infections (STIs) Get screened for STIs, including gonorrhea and chlamydia, if: You are sexually active and are younger than 43 years of age. You are older than 43 years of age and your health care provider tells you that you are at risk for this type of infection. Your sexual activity has changed since you were last screened, and you are at increased risk for chlamydia or gonorrhea.  Ask your health care provider if you are at risk. Ask your health care provider about whether you are at high risk for HIV. Your health care provider may recommend a prescription medicine to help prevent HIV infection. If you choose to take medicine to prevent HIV, you should first get tested for HIV. You should then be tested every 3 months for as  long as you are taking the medicine. Pregnancy If you are about to stop having your period (premenopausal) and you may become pregnant, seek counseling before you get pregnant. Take 400 to 800 micrograms (mcg) of folic acid every day if you become pregnant. Ask for birth control (contraception) if you want to prevent pregnancy. Osteoporosis and menopause Osteoporosis is a disease in which the bones lose minerals and strength with aging. This can result in bone fractures. If you are 7 years old or older, or if you are at risk for osteoporosis and fractures, ask your health care provider if you should: Be screened for bone loss. Take a calcium or vitamin D supplement to lower your risk of fractures. Be given hormone replacement therapy (HRT) to treat symptoms of menopause. Follow these instructions at home: Lifestyle Do not use any products that contain nicotine or tobacco, such as cigarettes, e-cigarettes, and chewing tobacco. If you need help quitting, ask your health care provider. Do not use street drugs. Do not share needles. Ask your health care provider for help if you need support or information about quitting drugs. Alcohol use Do not drink alcohol if: Your health care provider tells you not to drink. You are pregnant, may be pregnant, or are planning to become pregnant. If you drink alcohol: Limit how much you use to 0-1 drink a day. Limit intake if you are breastfeeding. Be aware of how much alcohol is in your drink. In the [place] one drink equals one 12 oz bottle of beer (355 mL), one 5 oz glass of wine (148 mL), or one 1 oz glass of hard liquor (44 mL). General instructions Schedule regular health, dental, and eye exams. Stay current with your vaccines. Tell your health care provider if: You often feel depressed. You have ever been abused or do not feel safe at home. Summary Adopting a healthy lifestyle and getting preventive care are important in promoting health and  wellness. Follow your health care provider's instructions about healthy diet, exercising, and getting tested or screened for diseases. Follow your health care provider's instructions on monitoring your cholesterol and blood pressure. This information is not intended to replace advice given to you by your health care provider. Make sure you discuss any questions you have with your healthcare provider. Document Revised: 09/09/2018 Document Reviewed: 09/09/2018 Elsevier Patient Education  2022 [employer].

## 2021-05-07 ENCOUNTER — Encounter: Payer: Self-pay | Admitting: Family Medicine

## 2021-05-29 ENCOUNTER — Other Ambulatory Visit: Payer: Self-pay | Admitting: Family Medicine

## 2021-05-29 DIAGNOSIS — Z1231 Encounter for screening mammogram for malignant neoplasm of breast: Secondary | ICD-10-CM

## 2021-07-16 ENCOUNTER — Ambulatory Visit
Admission: RE | Admit: 2021-07-16 | Discharge: 2021-07-16 | Disposition: A | Discharge status: Home / Self Care | Payer: 59 | Source: Ambulatory Visit | Attending: Family Medicine | Admitting: Family Medicine

## 2021-07-16 ENCOUNTER — Other Ambulatory Visit: Payer: Self-pay

## 2021-07-16 DIAGNOSIS — Z1231 Encounter for screening mammogram for malignant neoplasm of breast: Secondary | ICD-10-CM

## 2021-08-08 ENCOUNTER — Encounter: Payer: Self-pay | Admitting: Family Medicine

## 2021-08-08 DIAGNOSIS — Z2839 Other underimmunization status: Secondary | ICD-10-CM

## 2021-08-10 ENCOUNTER — Other Ambulatory Visit: Payer: Self-pay

## 2021-08-10 ENCOUNTER — Other Ambulatory Visit (INDEPENDENT_AMBULATORY_CARE_PROVIDER_SITE_OTHER): Payer: 59

## 2021-08-10 DIAGNOSIS — Z2839 Other underimmunization status: Secondary | ICD-10-CM | POA: Diagnosis not present

## 2021-08-13 LAB — HEPATITIS B SURFACE ANTIBODY,QUALITATIVE: Hep B S Ab: REACTIVE — AB

## 2021-08-13 LAB — VARICELLA ZOSTER ANTIBODY, IGG: Varicella IgG: 814.9 index

## 2021-08-20 ENCOUNTER — Encounter: Payer: Self-pay | Admitting: Family Medicine

## 2021-08-20 DIAGNOSIS — Z2839 Other underimmunization status: Secondary | ICD-10-CM

## 2021-08-22 ENCOUNTER — Telehealth: Payer: Self-pay | Admitting: Family Medicine

## 2021-08-22 NOTE — Telephone Encounter (Signed)
Called patient and left vm to schedule lab

## 2021-08-22 NOTE — Telephone Encounter (Signed)
Plz schedule lab visit for Monday.

## 2021-08-28 ENCOUNTER — Other Ambulatory Visit (INDEPENDENT_AMBULATORY_CARE_PROVIDER_SITE_OTHER): Payer: 59

## 2021-08-28 ENCOUNTER — Other Ambulatory Visit: Payer: Self-pay

## 2021-08-28 ENCOUNTER — Telehealth: Payer: Self-pay | Admitting: Family Medicine

## 2021-08-28 DIAGNOSIS — Z2839 Other underimmunization status: Secondary | ICD-10-CM | POA: Diagnosis not present

## 2021-08-28 NOTE — Telephone Encounter (Signed)
ERROR

## 2021-08-28 NOTE — Telephone Encounter (Signed)
Pt dropped off paperwork  Type of forms received:physical   Routed [id number]  Paperwork received by : Yvonne/Terrill   Individual made aware of 3-5 business day turn around (Y/N): y Form completed and patient made aware of charges(Y/N):y   Faxed to :   Form location:  dr g box

## 2021-08-29 LAB — MEASLES/MUMPS/RUBELLA IMMUNITY
Mumps IgG: 220 AU/mL
Rubella: 4.24 Index
Rubeola IgG: 276 AU/mL

## 2021-08-29 LAB — HEPATITIS B SURFACE ANTIBODY, QUANTITATIVE: Hep B S AB Quant (Post): >1000 m[IU]/mL (ref >=10)

## 2021-08-30 LAB — QUANTIFERON-TB GOLD PLUS
Mitogen-NIL: >10 IU/mL
NIL: 0.04 IU/mL
QuantiFERON-TB Gold Plus: NEGATIVE
TB1-NIL: 0.01 IU/mL
TB2-NIL: 0 IU/mL

## 2021-08-31 NOTE — Telephone Encounter (Signed)
Per Terrill, ppw completed and returned to pt.

## 2021-11-27 IMAGING — MG DIGITAL SCREENING BILAT W/ TOMO W/ CAD
8 series · 8 of 24 positions shown · non-contrast
Comparison: Previous exam(s).

CLINICAL DATA: Screening.

EXAM:
DIGITAL SCREENING BILATERAL MAMMOGRAM WITH TOMO AND CAD

[R MLO synth-2D]
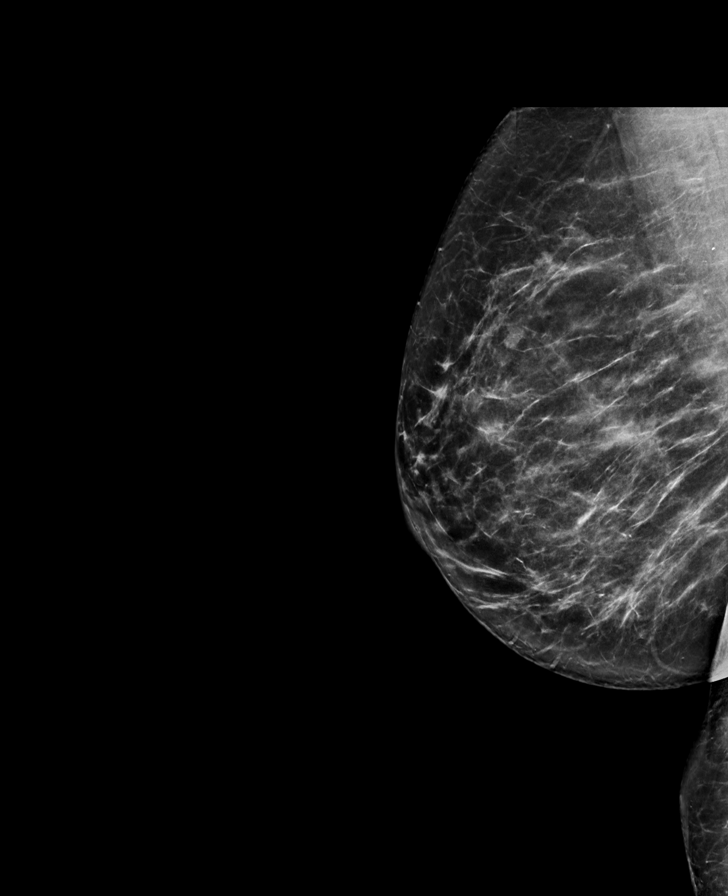

[R CC synth-2D]
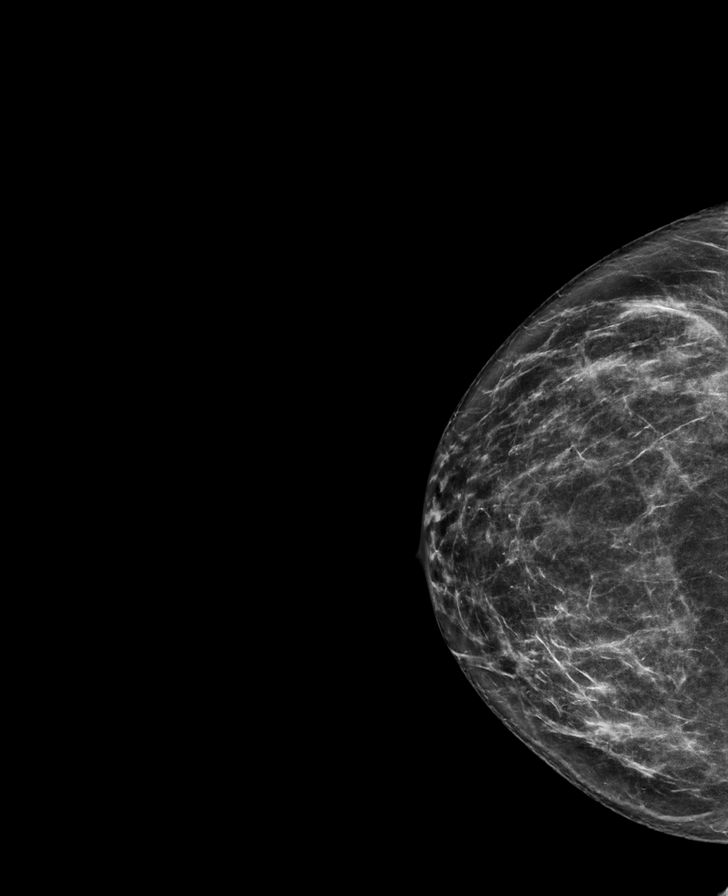

[L MLO synth-2D]
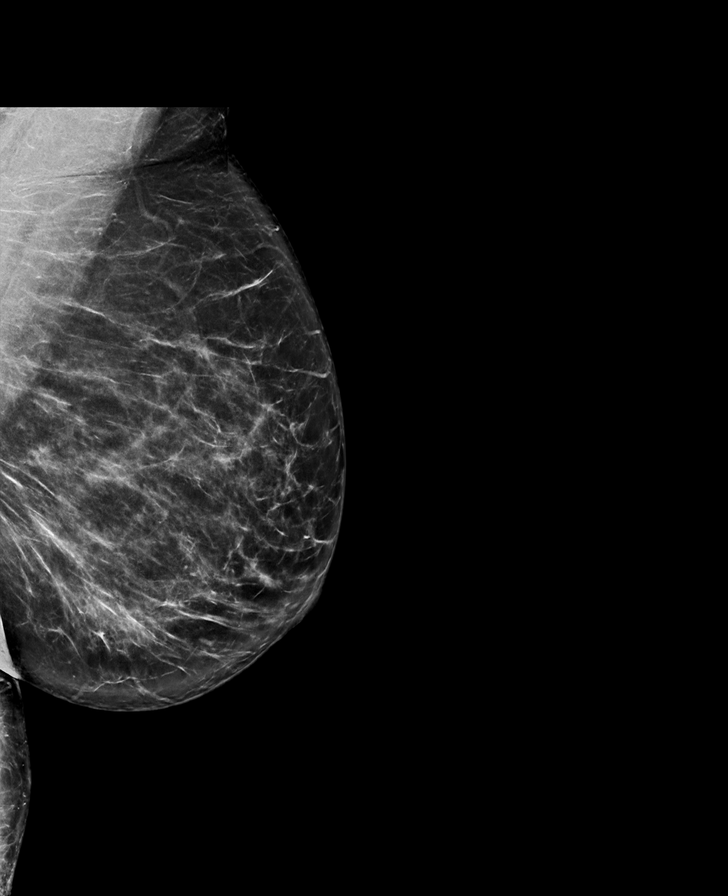

[L CC synth-2D]
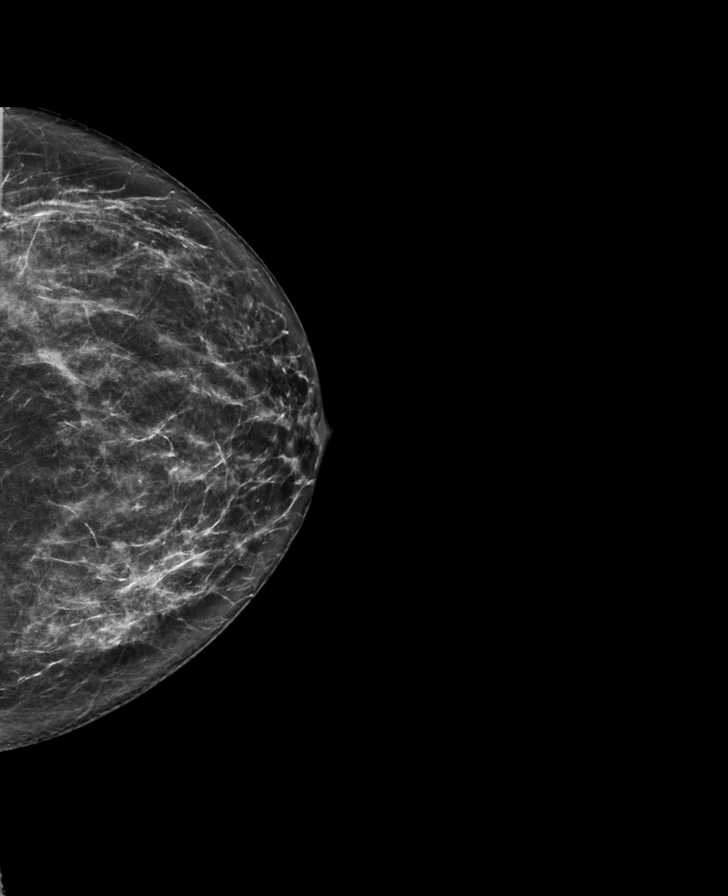

[R MLO tomo · tomo slice 41/81.0]
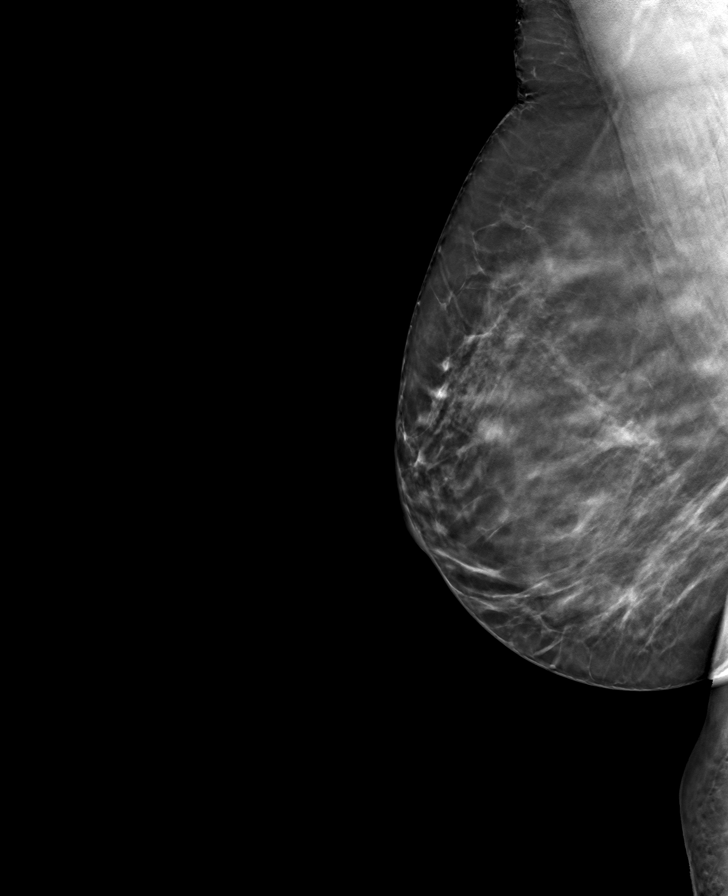

[R CC tomo · tomo slice 38/75.0]
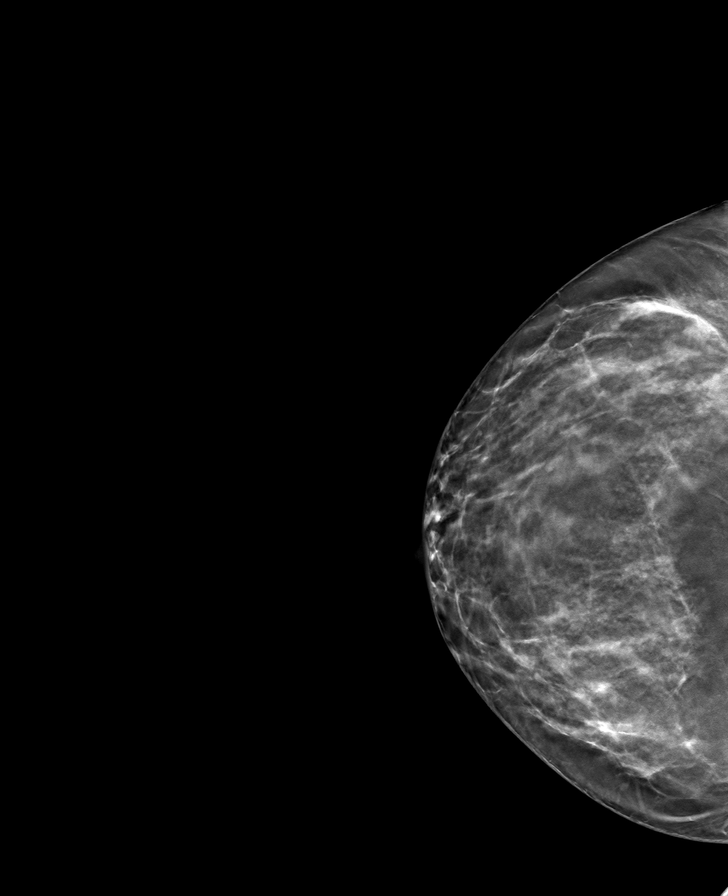

[L CC tomo · tomo slice 37/72.0]
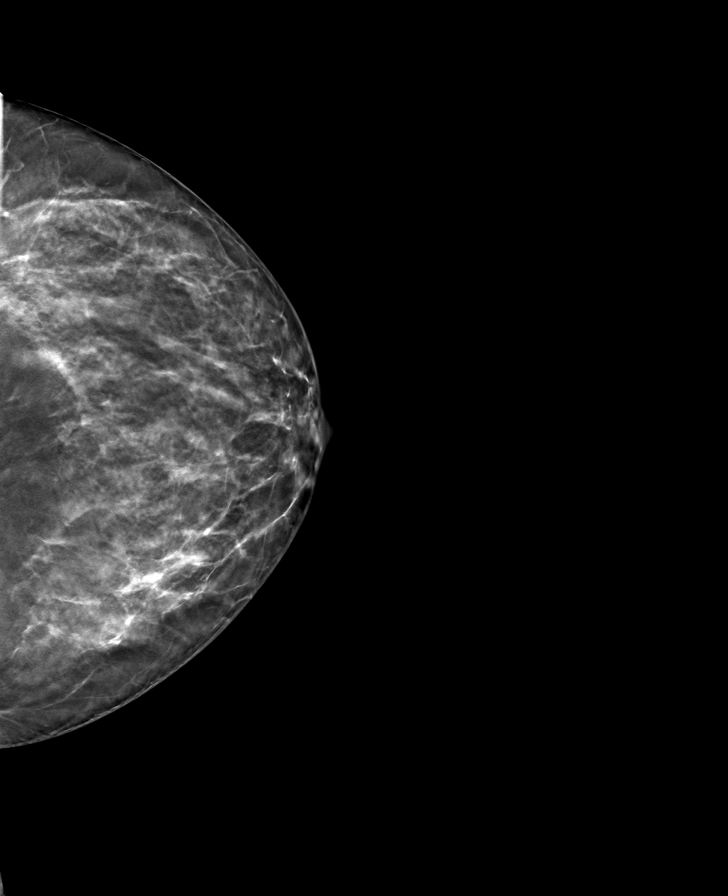

[L MLO tomo · tomo slice 43/85.0]
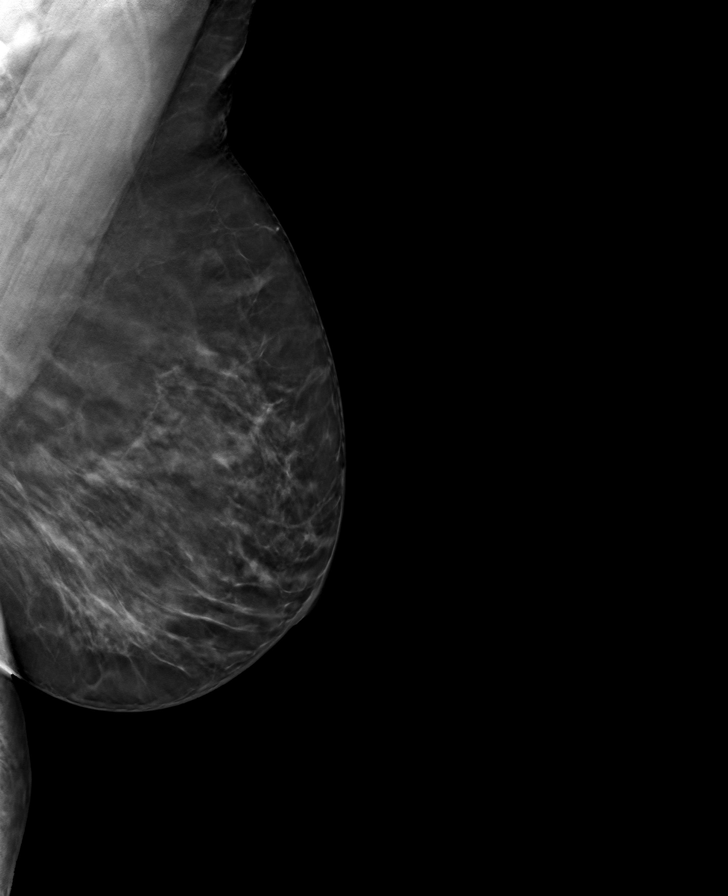

[8 of 24 positions shown; findings below may reference images not displayed]

ACR Breast Density Category c: The breast tissue is heterogeneously
dense, which may obscure small masses.
FINDINGS: In the left breast, a possible mass warrants further evaluation. In
the right breast, no findings suspicious for malignancy.

Images were processed with CAD.
IMPRESSION: Further evaluation is suggested for a possible mass in the left
breast.

RECOMMENDATION:
Diagnostic mammogram and possibly ultrasound of the left breast.
(Code:[id number])

The patient will be contacted regarding the findings, and additional
imaging will be scheduled.

BI-RADS CATEGORY  0: Incomplete. Need additional imaging evaluation
and/or prior mammograms for comparison.

## 2022-01-09 ENCOUNTER — Other Ambulatory Visit: Payer: Self-pay | Admitting: Obstetrics and Gynecology

## 2022-01-09 DIAGNOSIS — Z803 Family history of malignant neoplasm of breast: Secondary | ICD-10-CM

## 2022-01-15 ENCOUNTER — Encounter: Payer: Self-pay | Admitting: Family Medicine

## 2022-01-15 ENCOUNTER — Ambulatory Visit: Payer: 59 | Admitting: Family Medicine

## 2022-01-15 DIAGNOSIS — M25561 Pain in right knee: Secondary | ICD-10-CM

## 2022-01-15 DIAGNOSIS — E669 Obesity, unspecified: Secondary | ICD-10-CM | POA: Diagnosis not present

## 2022-01-15 MED ORDER — WEGOVY 0.25 MG/0.5ML ~~LOC~~ SOAJ
0.2500 mg | SUBCUTANEOUS | 0 refills | Status: DC
Start: 2022-01-15 — End: 2022-02-22

## 2022-01-15 NOTE — Patient Instructions (Addendum)
Schedule follow up with Dr Lorelei Pont for evaluation of knee crepitus.  ?I've sent in [place] to your pharmacy to price out. Let me know what you find out.  ?Schedule follow up visit 4-6 weeks after starting medicine.  ?

## 2022-01-15 NOTE — Progress Notes (Signed)
? ? Patient ID: Jasmine Dixon, female    DOB: 1975/11/18, 46 y.o.   MRN: [id number] ? ?This visit was conducted in person. ? ?BP 132/80   Pulse 99   Temp 97.7 ?F (36.5 ?C) (Temporal)   Ht 5' 4.5" (1.638 m)   Wt 190 lb (86.2 kg)   LMP 12/23/2021   SpO2 100%   BMI 32.11 kg/m?   ? ?CC: discuss weight medication ?Subjective:  ? ?HPI: ?Jasmine Dixon is a 46 y.o. female presenting on 01/15/2022 for Weight Management (Wants to discuss starting Wegovy. ) ? ? 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  ? ?Starting weight: 190 lbs ?Last weight: 190 lbs ?Today's weight 190 lbs ? ?Has not tried medicine for weight loss.  ?Has tried weight watchers with temporary benefit.  ? ?24 hour recall: ?No breakfast  ?Lunch at [facility] - Core protein shake  ?Snack - cupful of goldfish throughout the day  ?Dinner at [employer] - roasted chicken and green beans  ?Snack at 8pm - cup of yogurt  ?Drinks water throughout the day.  ?No sodas, no juice, sweet tea.  ?No coffee yesterday, but drinks significant coffee.  ?Eating in on week days, eating out on weekends.  ? ?Activity regimen: ?1 day a week going to gym - boot camp style exercise program, both cardio and resistance training.  ? ?Ongoing marked crepitus to R>L knees, chronic.  ?   ? ?Relevant past medical, surgical, family and social history reviewed and updated as indicated. Interim medical history since our last visit reviewed. ?Allergies and medications reviewed and updated. ?Outpatient Medications Prior to Visit  ?Medication Sig Dispense Refill  ? metoprolol tartrate (LOPRESSOR) 25 MG tablet Take 1 tablet (25 mg total) by mouth 2 (two) times daily as needed (palpitations). 30 tablet 3  ? ?No facility-administered medications prior to visit.  ?  ? ?Per HPI unless specifically indicated in ROS section below ?Review of Systems ? ?Objective:  ?BP 132/80   Pulse 99   Temp 97.7 ?F (36.5 ?C) (Temporal)   Ht 5' 4.5" (1.638 m)   Wt 190 lb (86.2 kg)   LMP  12/23/2021   SpO2 100%   BMI 32.11 kg/m?   ?Wt Readings from Last 3 Encounters:  ?01/15/22 190 lb (86.2 kg)  ?05/04/21 190 lb 5 oz (86.3 kg)  ?04/28/20 198 lb 8 oz (90 kg)  ?  ?  ?Physical Exam ?Vitals and nursing note reviewed.  ?Constitutional:   ?   Appearance: Normal appearance. She is not ill-appearing.  ?Cardiovascular:  ?   Rate and Rhythm: Normal rate and regular rhythm.  ?   Pulses: Normal pulses.  ?   Heart sounds: Normal heart sounds. No murmur heard. ?Pulmonary:  ?   Effort: Pulmonary effort is normal. No respiratory distress.  ?   Breath sounds: Normal breath sounds. No wheezing, rhonchi or rales.  ?Musculoskeletal:  ?   Right lower leg: No edema.  ?   Left lower leg: No edema.  ?   Comments:  ?FROM bilateral knees ?Marked crepitus to R>L kneecaps with flexion/extension ?No significant PF grind/tenderness ?Neg McMurray's bilaterally  ?Skin: ?   General: Skin is warm and dry.  ?   Findings: No rash.  ?Neurological:  ?   Mental Status: She is alert.  ?Psychiatric:     ?   Mood and Affect: Mood normal.     ?   Behavior: Behavior normal.  ? ?   ? ?  Assessment & Plan:  ? ?Problem List Items Addressed This Visit   ? ? Obesity, Class I, BMI 30-34.9  ?  Discussed healthy diet and lifestyle choices for sustainable weight loss. Reviewed 24 hour food recall. Discussed exercise routine.  ?She is interested in bariatric medication, has previously not tried any.  ?Discussed GLP1RA mechanism of action as well as side effects to watch for. Will send in wegovy to price out. 0.'25mg'$  weekly x 1 month then further titrate as tolerated.  ?RTC 4-6 wks f/u after starting bariatric medication.  ?No fmhx medullary thyroid cancer or MEN.  ? ?  ?  ? Anterior knee pain, right  ?  R>L with marked crepitus, ?PFPS vs chondromalacia of patella - did recommend SM f/u. Will mail patient handout with exercises for runner's knee (patellofemoral pain syndrome).  ? ?  ?  ?  ? ?Meds ordered this encounter  ?Medications  ? Semaglutide-Weight  Management (WEGOVY) 0.25 MG/0.5ML SOAJ  ?  Sig: Inject 0.25 mg into the skin once a week.  ?  Dispense:  2 mL  ?  Refill:  0  ? ?No orders of the defined types were placed in this encounter. ? ? ? ?Patient Instructions  ?Schedule follow up with Dr Lorelei Pont for evaluation of knee crepitus.  ?I've sent in Ebro to your pharmacy to price out. Let me know what you find out.  ?Schedule follow up visit 4-6 weeks after starting medicine.  ? ?Follow up plan: ?Return in about 4 weeks (around 02/12/2022) for follow up visit. ? ?Ria Bush, MD   ?

## 2022-01-15 NOTE — Assessment & Plan Note (Addendum)
R>L with marked crepitus, ?PFPS vs chondromalacia of patella - did recommend SM f/u. Will mail patient handout with exercises for runner's knee (patellofemoral pain syndrome).  ?

## 2022-01-15 NOTE — Assessment & Plan Note (Signed)
Discussed healthy diet and lifestyle choices for sustainable weight loss. Reviewed 24 hour food recall. Discussed exercise routine.  ?She is interested in bariatric medication, has previously not tried any.  ?Discussed GLP1RA mechanism of action as well as side effects to watch for. Will send in wegovy to price out. 0.'25mg'$  weekly x 1 month then further titrate as tolerated.  ?RTC 4-6 wks f/u after starting bariatric medication.  ?No fmhx medullary thyroid cancer or MEN.  ?

## 2022-01-17 ENCOUNTER — Telehealth: Payer: Self-pay

## 2022-01-17 NOTE — Telephone Encounter (Signed)
Received PA approval via CoverMyMeds, valid through 08/19/2022. Made pharmacy aware.  ?

## 2022-01-17 NOTE — Telephone Encounter (Signed)
Received faxed PA requests for Wegovy 0.25 mg/0.5 mL SOAJ; key:  [id number].  Decision pending.  ?

## 2022-01-23 ENCOUNTER — Encounter: Payer: Self-pay | Admitting: Family Medicine

## 2022-01-23 ENCOUNTER — Ambulatory Visit (INDEPENDENT_AMBULATORY_CARE_PROVIDER_SITE_OTHER)
Admission: RE | Admit: 2022-01-23 | Discharge: 2022-01-23 | Disposition: A | Discharge status: Home / Self Care | Payer: 59 | Source: Ambulatory Visit | Attending: Family Medicine | Admitting: Family Medicine

## 2022-01-23 ENCOUNTER — Ambulatory Visit: Payer: 59 | Admitting: Family Medicine

## 2022-01-23 VITALS — BP 120/82 | HR 71 | Temp 98.4°F | Ht 64.5 in | Wt 191.1 lb

## 2022-01-23 DIAGNOSIS — M25561 Pain in right knee: Secondary | ICD-10-CM | POA: Diagnosis not present

## 2022-01-23 DIAGNOSIS — G8929 Other chronic pain: Secondary | ICD-10-CM | POA: Diagnosis not present

## 2022-01-23 DIAGNOSIS — M1711 Unilateral primary osteoarthritis, right knee: Secondary | ICD-10-CM | POA: Diagnosis not present

## 2022-01-23 DIAGNOSIS — D1621 Benign neoplasm of long bones of right lower limb: Secondary | ICD-10-CM

## 2022-01-23 NOTE — Patient Instructions (Signed)
Patellar tracking braces: ? ?True-pull lite ? ?Patellar J brace ?

## 2022-01-23 NOTE — Progress Notes (Signed)
? ? ?Azarya Oconnell T. Olina Melfi, MD, [facility] ?Therapist, music at [facility] ?[place] ?[place], [address] ? ?Phone: [phone]  FAX: [phone] ? ?Jasmine Dixon - 46 y.o. female  MRN [id number]  Date of Birth: Oct 29, 1975 ? ?Date: 01/23/2022  PCP: Ria Bush, MD  Referral: Ria Bush, MD ? ?Chief Complaint  ?Patient presents with  ? Knee Pain  ?  RIght  ? ? ?This visit occurred during the SARS-CoV-2 public health emergency.  Safety protocols were in place, including screening questions prior to the visit, additional usage of staff PPE, and extensive cleaning of exam room while observing appropriate contact time as indicated for disinfecting solutions.  ? ?Subjective:  ? ?Jasmine Dixon is a 46 y.o. very pleasant female patient with Body mass index is 32.29 kg/m?. who presents with the following: ? ?She is a very pleasant young 46 year old patient, she is seen here today in consultation for right-sided knee pain, see my partner Dr. Danise Mina. ? ?She is a globally healthy young lady, and she does take Lopressor only as it had needed basis with occasional palpitation. ? ?She does have some right-sided knee intermittent anterior knee pain with some crepitus. ? ?Not that much pain and some pain.  Going up and down stairs.  Not when seated.   ?No box jumps. ?Lunges will hurt.  ?Jump rope hurts.  ?She does have a lot of crepitus with movement, particularly of the R knee. ? ?She is normally quite active.  She does have pain with any kind of movement ballistic movements as above.  She is not functionally limited, but she does have a good bit of anterior knee pain. ? ?Basic over-the-counter remedies have not really provided much significant relief.  She is well versed in strengthening and overall fitness. ? ?3 days a week or more. ?NP, nurse practitioner.  ? ? ? ?Review of Systems is noted in the HPI, as appropriate  ? ?Objective:  ? ?BP 120/82   Pulse 71   Temp 98.4  ?F (36.9 ?C) (Oral)   Ht 5' 4.5" (1.638 m)   Wt 191 lb 1 oz (86.7 kg)   LMP 01/18/2022   SpO2 99%   BMI 32.29 kg/m?  ? ?Right knee: Full extension and flexion to 120.  Stable to varus and valgus stress.  Lachman as well as anterior posterior drawer testing are normal. ? ?Does have pain with loading the medial and lateral patellar facets, right greater than left.  There is very notable crepitus with motion in flexion and extension at the patella, and this is a positive grind maneuver without apprehension. ? ?Flexion pinch as well as McMurray's produces no pain.  Bounce home testing is negative.  Nontender at the tibial plateau or fibular head.  Patellar and quad tendons are nontender. ? ?Strength testing is 5/5 in all directions. ?Otherwise neurovascularly intact. ? ?Radiology: ?DG Knee 4 Views W/Patella Right ? ?Result Date: 01/23/2022 ?CLINICAL DATA:  Anterior knee pain with significant crepitus. Discomfort at patellofemoral than 1 year. EXAM: RIGHT KNEE - COMPLETE 4+ VIEW COMPARISON:  None. FINDINGS: There is mild patellofemoral joint space narrowing. There is a bony excrescence seen broadly along the medial aspect of the distal femoral metadiaphysis that appears to be contiguous with the underlying femoral cortex and marrow, favored to represent a sessile osteochondroma extending medially and anteriorly. Overall measurement is difficult, however this measures up to approximately 4 cm in craniocaudal dimension, 1.2 cm in transverse dimension, and 1.4 cm  in AP dimension. No joint effusion.  No acute fracture or dislocation. Frontal view of the left knee is unremarkable. IMPRESSION: There appears to be a sessile osteochondroma extending medially and anteriorly from the distal femoral metadiaphysis. No significant osteoarthritis.  No acute fracture is seen. Electronically Signed   By: Yvonne Kendall M.D.   On: 01/23/2022 18:24    ? ?The radiological images were independently reviewed by myself in the office and  results were reviewed with the patient. My independent interpretation of images: On my review, there is a large osteochondroma intermediately at the distal femur.  There is also significant lateral tilt on the sunrise view.  There is also joint space narrowing at the sunrise view.  Medial and lateral compartments appear grossly normal. Electronically Signed  By: Owens Loffler, MD On: 01/23/2022  9:00 AM EDT  ? ?Assessment and Plan:  ? ?  ICD-10-CM   ?1. Patellofemoral arthritis of right knee  M17.11   ?  ?2. Chronic pain of right knee  M25.561 DG Knee 4 Views W/Patella Right  ? G89.29   ?  ?3. Osteochondroma of femur, right  D16.21   ?  ? ?I think that her pain and patellar crepitus are largely driven by anatomical predisposition.  She does appear to have some joint space narrowing at the patellofemoral joint, and a very large osteochondroma.  Think that this is significantly impacting the patellar facets with flexion and extension, leading to significant patellofemoral pain and arthritic changes at the patella. ? ?Anatomical changes are going to be an impossible risk factor to modify.  Think that vastus medialis quadricep strengthening would likely help, continue to work on hip and core strength.  Weight loss would also help as well. ? ?While exercising, I do think that a patellar tracking brace would be of benefit. ? ?Social: Limiting ability to maximally exercise ? ?Patient Instructions  ?Patellar tracking braces: ? ?True-pull lite ? ?Patellar J brace  ? ? ?No orders of the defined types were placed in this encounter. ? ?There are no discontinued medications. ?Orders Placed This Encounter  ?Procedures  ? DG Knee 4 Views W/Patella Right  ? ? ?Follow-up: No follow-ups on file. ? ?[employer] One speech-to-text software was used for transcription in this dictation.  Possible transcriptional errors can occur using Editor, commissioning.  ? ?Signed, ? ?Jolea Dolle T. Teandra Harlan, MD ? ? ?Outpatient Encounter Medications as of  01/23/2022  ?Medication Sig  ? metoprolol tartrate (LOPRESSOR) 25 MG tablet Take 1 tablet (25 mg total) by mouth 2 (two) times daily as needed (palpitations).  ? Semaglutide-Weight Management (WEGOVY) 0.25 MG/0.5ML SOAJ Inject 0.25 mg into the skin once a week. (Patient not taking: Reported on 01/23/2022)  ? ?No facility-administered encounter medications on file as of 01/23/2022.  ?  ?

## 2022-01-30 ENCOUNTER — Other Ambulatory Visit: Payer: 59

## 2022-02-22 ENCOUNTER — Encounter: Payer: Self-pay | Admitting: Family Medicine

## 2022-02-22 ENCOUNTER — Ambulatory Visit: Payer: 59 | Admitting: Family Medicine

## 2022-02-22 VITALS — BP 114/78 | HR 82 | Temp 97.7°F | Ht 64.5 in | Wt 184.0 lb

## 2022-02-22 DIAGNOSIS — M222X1 Patellofemoral disorders, right knee: Secondary | ICD-10-CM

## 2022-02-22 DIAGNOSIS — E669 Obesity, unspecified: Secondary | ICD-10-CM

## 2022-02-22 DIAGNOSIS — M222X2 Patellofemoral disorders, left knee: Secondary | ICD-10-CM

## 2022-02-22 MED ORDER — WEGOVY 0.5 MG/0.5ML ~~LOC~~ SOAJ: 0.5000 mg | SUBCUTANEOUS | 0 refills | Status: DC

## 2022-02-22 NOTE — Assessment & Plan Note (Signed)
Saw Dr Lorelei Pont for significant patellar crepitus thought due to anatomical predisposition - rec patellar tracking braces and VMO quad strengthening. Knee xray showed huge osteochondroma of R knee.

## 2022-02-22 NOTE — Progress Notes (Signed)
Patient ID: Jasmine Dixon, female    DOB: January 24, 1976, 46 y.o.   MRN: [id number]  This visit was conducted in person.  BP 114/78   Pulse 82   Temp 97.7 F (36.5 C) (Temporal)   Ht 5' 4.5" (1.638 m)   Wt 184 lb (83.5 kg)   LMP 02/15/2022   SpO2 96%   BMI 31.10 kg/m    CC: weight management visit  Subjective:   HPI: Jasmine Dixon is a 46 y.o. female presenting on 02/22/2022 for Weight Management (Here for 5 wk f/u.)   Starting weight: 190 lbs Last weight: 191 lbs Today's weight 184 lbs  Wegovy started 12/2021, tolerating well without nausea, epigastric pain. Some constipation managed with QOD senna. Notes appetite increases about 3 days after injection.   24 hour recall: See prior note.   Activity regimen: Continues boot camp style exercise program once weekly.      Relevant past medical, surgical, family and social history reviewed and updated as indicated. Interim medical history since our last visit reviewed. Allergies and medications reviewed and updated. Outpatient Medications Prior to Visit  Medication Sig Dispense Refill   metoprolol tartrate (LOPRESSOR) 25 MG tablet Take 1 tablet (25 mg total) by mouth 2 (two) times daily as needed (palpitations). 30 tablet 3   Semaglutide-Weight Management (WEGOVY) 0.25 MG/0.5ML SOAJ Inject 0.25 mg into the skin once a week. 2 mL 0   No facility-administered medications prior to visit.     Per HPI unless specifically indicated in Jasmine section below Review of Systems  Objective:  BP 114/78   Pulse 82   Temp 97.7 F (36.5 C) (Temporal)   Ht 5' 4.5" (1.638 m)   Wt 184 lb (83.5 kg)   LMP 02/15/2022   SpO2 96%   BMI 31.10 kg/m   Wt Readings from Last 3 Encounters:  02/22/22 184 lb (83.5 kg)  01/23/22 191 lb 1 oz (86.7 kg)  01/15/22 190 lb (86.2 kg)      Physical Exam Vitals and nursing note reviewed.  Constitutional:      Appearance: Normal appearance. She is not ill-appearing.  Neck:     Thyroid: No  thyroid mass, thyromegaly or thyroid tenderness.  Cardiovascular:     Rate and Rhythm: Normal rate and regular rhythm.     Pulses: Normal pulses.     Heart sounds: Normal heart sounds. No murmur heard. Pulmonary:     Effort: Pulmonary effort is normal. No respiratory distress.     Breath sounds: Normal breath sounds. No wheezing, rhonchi or rales.  Neurological:     Mental Status: She is alert.  Psychiatric:        Mood and Affect: Mood normal.        Behavior: Behavior normal.       Assessment & Plan:   Problem List Items Addressed This Visit     Obesity, Class I, BMI 30-34.9 - Primary    Weight loss already noted on lowest wegovy dose. Mild constipation managed with senna, tolerating otherwise well. Will increase Wegovy to 0.'5mg'$  weekly. She will update me via MyChart after another month for next dose titration. RTC 2 mo f/u visit.        Patellofemoral disorder of both knees    Saw Dr Lorelei Pont for significant patellar crepitus thought due to anatomical predisposition - rec patellar tracking braces and VMO quad strengthening. Knee xray showed huge osteochondroma of R knee.  Meds ordered this encounter  Medications   Semaglutide-Weight Management (WEGOVY) 0.5 MG/0.5ML SOAJ    Sig: Inject 0.5 mg into the skin once a week.    Dispense:  2 mL    Refill:  0   No orders of the defined types were placed in this encounter.    Patient Instructions  You are doing great today! Return in 2 months for follow up visit on Wegovy.  Let me know sooner if any trouble.   Follow up plan: Return in about 2 months (around 04/24/2022) for follow up visit.  Ria Bush, MD

## 2022-02-22 NOTE — Assessment & Plan Note (Signed)
Weight loss already noted on lowest wegovy dose. Mild constipation managed with senna, tolerating otherwise well. Will increase Wegovy to 0.'5mg'$  weekly. She will update me via MyChart after another month for next dose titration. RTC 2 mo f/u visit.

## 2022-02-22 NOTE — Patient Instructions (Signed)
You are doing great today! Return in 2 months for follow up visit on Wegovy.  Let me know sooner if any trouble.

## 2022-03-04 ENCOUNTER — Encounter: Payer: Self-pay | Admitting: Family Medicine

## 2022-04-06 ENCOUNTER — Inpatient Hospital Stay (HOSPITAL_COMMUNITY)
Admission: AD | Admit: 2022-04-06 | Discharge: 2022-04-06 | Disposition: A | Discharge status: Home / Self Care | Payer: 59 | Attending: Obstetrics | Admitting: Obstetrics

## 2022-04-06 ENCOUNTER — Encounter (HOSPITAL_COMMUNITY): Payer: Self-pay | Admitting: Obstetrics

## 2022-04-06 DIAGNOSIS — O009 Unspecified ectopic pregnancy without intrauterine pregnancy: Secondary | ICD-10-CM | POA: Diagnosis present

## 2022-04-06 NOTE — MAU Note (Signed)
...  Jasmine Dixon is a 46 y.o. at approximately 73w1dhere in MAU reporting: Sent here from the office per Dr. HPhilis Pique MD. Patient reports she was informed she had an ectopic pregnancy. She reports she was informed they saw a "sac" but did not disclose where the sac was. She reports her hcg on 6/28 was 13,820 and on 7/7 was 22, 212.  LMP: 02/15/2022 - irregular periods Pain score: Denies pain.

## 2022-04-24 ENCOUNTER — Ambulatory Visit: Payer: 59 | Admitting: Family Medicine

## 2022-04-25 ENCOUNTER — Encounter: Payer: Self-pay | Admitting: Family Medicine

## 2022-04-26 NOTE — Telephone Encounter (Signed)
Wegovy Last rx:  02/22/22, #2 mL Last OV: 02/22/22, wt mgmt Next OV:  05/06/22, CPE

## 2022-04-28 ENCOUNTER — Other Ambulatory Visit: Payer: Self-pay | Admitting: Family Medicine

## 2022-04-28 DIAGNOSIS — E669 Obesity, unspecified: Secondary | ICD-10-CM

## 2022-04-29 ENCOUNTER — Other Ambulatory Visit (INDEPENDENT_AMBULATORY_CARE_PROVIDER_SITE_OTHER): Payer: 59

## 2022-04-29 DIAGNOSIS — E669 Obesity, unspecified: Secondary | ICD-10-CM

## 2022-04-29 LAB — BASIC METABOLIC PANEL
BUN: 9 mg/dL (ref 6–23)
CO2: 26 mEq/L (ref 19–32)
Calcium: 9.2 mg/dL (ref 8.4–10.5)
Chloride: 102 mEq/L (ref 96–112)
Creatinine, Ser: 0.71 mg/dL (ref 0.40–1.20)
GFR: 103.59 mL/min (ref >60.00)
Glucose, Bld: 90 mg/dL (ref 70–99)
Potassium: 4 mEq/L (ref 3.5–5.1)
Sodium: 137 mEq/L (ref 135–145)

## 2022-04-29 LAB — LIPID PANEL
Cholesterol: 182 mg/dL (ref 0–200)
HDL: 74.5 mg/dL (ref >39.00)
LDL Cholesterol: 88 mg/dL (ref 0–99)
NonHDL: 107.3
Total CHOL/HDL Ratio: 2
Triglycerides: 98 mg/dL (ref 0.0–149.0)
VLDL: 19.6 mg/dL (ref 0.0–40.0)

## 2022-04-29 LAB — TSH: TSH: 1.12 u[IU]/mL (ref 0.35–5.50)

## 2022-04-29 MED ORDER — WEGOVY 0.5 MG/0.5ML ~~LOC~~ SOAJ: 0.5000 mg | SUBCUTANEOUS | 0 refills | Status: DC

## 2022-05-06 ENCOUNTER — Telehealth: Payer: Self-pay

## 2022-05-06 ENCOUNTER — Ambulatory Visit (INDEPENDENT_AMBULATORY_CARE_PROVIDER_SITE_OTHER): Payer: 59 | Admitting: Family Medicine

## 2022-05-06 ENCOUNTER — Encounter: Payer: Self-pay | Admitting: Family Medicine

## 2022-05-06 VITALS — BP 136/82 | HR 84 | Temp 97.7°F | Ht 64.25 in | Wt 180.4 lb

## 2022-05-06 DIAGNOSIS — Z0000 Encounter for general adult medical examination without abnormal findings: Secondary | ICD-10-CM | POA: Diagnosis not present

## 2022-05-06 DIAGNOSIS — M222X1 Patellofemoral disorders, right knee: Secondary | ICD-10-CM

## 2022-05-06 DIAGNOSIS — I493 Ventricular premature depolarization: Secondary | ICD-10-CM

## 2022-05-06 DIAGNOSIS — M222X2 Patellofemoral disorders, left knee: Secondary | ICD-10-CM

## 2022-05-06 DIAGNOSIS — E669 Obesity, unspecified: Secondary | ICD-10-CM | POA: Diagnosis not present

## 2022-05-06 MED ORDER — WEGOVY 0.5 MG/0.5ML ~~LOC~~ SOAJ: 0.5000 mg | SUBCUTANEOUS | 0 refills | Status: DC

## 2022-05-06 NOTE — Assessment & Plan Note (Signed)
Preventative protocols reviewed and updated unless pt declined. Discussed healthy diet and lifestyle.  

## 2022-05-06 NOTE — Progress Notes (Signed)
Patient ID: Jasmine Dixon, female    DOB: Sep 11, 1976, 46 y.o.   MRN: [id number]  This visit was conducted in person.  BP 136/82   Pulse 84   Temp 97.7 F (36.5 C) (Temporal)   Ht 5' 4.25" (1.632 m)   Wt 180 lb 6 oz (81.8 kg)   LMP 02/15/2022   SpO2 99%   BMI 30.72 kg/m    CC: CPE Subjective:   HPI: Jasmine Dixon is a 46 y.o. female presenting on 05/06/2022 for Annual Exam   Symptomatic PVCs controlled on rare beta blocker PRN - none recently. Work up included echo and exercise tolerance test. No recent need for BB.   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   Weight loss - difficulty finding wegovy due to [employer]. Latest we sent to [facility] pharmacy in [place] and she will start wegovy 0.'5mg'$  weekly this week. Starting weight =190 lbs. Continues going to gym. Constipation previously managed with senna QOD.    Saw Dr Lorelei Pont for marked knee PF crepitus - dx PF arthritis as well as osteochondroma of R femur. Recommended VMO quad strengthening and hip and core strengthening. Also placed on patellar tracking J brace.    Preventative: Well woman at [facility]). Sees yearly. H/o abnl paps, removed abnl cells s/p remote LEEP. Normal pap since then. [facility]  Flu shot yearly at work [place] 11/2019 x2, booster 09/16/2020 Tdap - 09/2012, 2022 (will let us know date) Seat belt use discussed.  Avoids sun. No changing moles on skin.  Sleep - averaging 7-8 hours/night  Non smoker  Alcohol - social  Dentist Q70moEye exam - yearly LMP 04/11/2022, regular cycles.   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 Interim medical history since our last visit reviewed. Allergies and medications reviewed and updated. Outpatient Medications Prior to Visit  Medication Sig Dispense Refill   metoprolol tartrate (LOPRESSOR) 25 MG tablet Take 1 tablet (25 mg total) by mouth 2 (two) times daily as needed (palpitations). 30 tablet 3   Semaglutide-Weight Management (WEGOVY) 0.5 MG/0.5ML SOAJ Inject 0.5 mg into the skin once a week. 2 mL 0   No facility-administered medications prior to visit.     Per HPI unless specifically indicated in ROS section below Review of Systems  Constitutional:  Negative for activity change, appetite change, chills, fatigue, fever and unexpected weight change.  HENT:  Negative for hearing loss.   Eyes:  Negative for visual disturbance.  Respiratory:  Negative for cough, chest tightness, shortness of breath and wheezing.   Cardiovascular:  Negative for chest pain, palpitations and leg swelling.  Gastrointestinal:  Negative for abdominal distention, abdominal pain, blood in stool, constipation, diarrhea, nausea and vomiting.  Genitourinary:  Negative for difficulty urinating and hematuria.  Musculoskeletal:  Negative for arthralgias, myalgias and neck pain.  Skin:  Negative for rash.  Neurological:  Negative for dizziness, seizures, syncope and headaches.  Hematological:  Negative for adenopathy. Bruises/bleeds easily.  Psychiatric/Behavioral:  Negative for dysphoric mood. The patient is not nervous/anxious.  Objective:  BP 136/82   Pulse 84   Temp 97.7 F (36.5 C) (Temporal)   Ht 5' 4.25" (1.632 m)   Wt 180 lb 6 oz (81.8 kg)   LMP 02/15/2022   SpO2 99%   BMI 30.72 kg/m   Wt Readings from Last 3 Encounters:  05/06/22 180 lb 6 oz (81.8 kg)  04/06/22 185 lb 6.4 oz (84.1 kg)  02/22/22 184 lb (83.5 kg)      Physical Exam Vitals and nursing note  reviewed.  Constitutional:      Appearance: Normal appearance. She is not ill-appearing.  HENT:     Head: Normocephalic and atraumatic.     Right Ear: Tympanic membrane, ear canal and external ear normal. There is no impacted cerumen.     Left Ear: Tympanic membrane, ear canal and external ear normal. There is no impacted cerumen.  Eyes:     General:        Right eye: No discharge.        Left eye: No discharge.     Extraocular Movements: Extraocular movements intact.     Conjunctiva/sclera: Conjunctivae normal.     Pupils: Pupils are equal, round, and reactive to light.  Neck:     Thyroid: No thyroid mass or thyromegaly.  Cardiovascular:     Rate and Rhythm: Normal rate and regular rhythm.     Pulses: Normal pulses.     Heart sounds: Normal heart sounds. No murmur heard. Pulmonary:     Effort: Pulmonary effort is normal. No respiratory distress.     Breath sounds: Normal breath sounds. No wheezing, rhonchi or rales.  Abdominal:     General: Bowel sounds are normal. There is no distension.     Palpations: Abdomen is soft. There is no mass.     Tenderness: There is no abdominal tenderness. There is no guarding or rebound.     Hernia: No hernia is present.  Musculoskeletal:     Cervical back: Normal range of motion and neck supple. No rigidity.     Right lower leg: No edema.     Left lower leg: No edema.  Lymphadenopathy:     Cervical: No cervical adenopathy.  Skin:    General: Skin is warm and dry.     Findings: No rash.  Neurological:     General: No focal deficit present.     Mental Status: She is alert. Mental status is at baseline.  Psychiatric:        Mood and Affect: Mood normal.        Behavior: Behavior normal.       Results for orders placed or performed in visit on 04/29/22  TSH  Result Value Ref Range   TSH 1.12 0.35 - 5.50 uIU/mL  Basic metabolic panel  Result Value Ref Range   Sodium 137 135 - 145 mEq/L   Potassium 4.0 3.5 - 5.1 mEq/L   Chloride 102  96 - 112 mEq/L   CO2 26 19 - 32 mEq/L   Glucose, Bld 90 70 - 99 mg/dL   BUN 9 6 - 23 mg/dL   Creatinine, Ser 0.71 0.40 - 1.20 mg/dL   GFR 103.59 >60.00 mL/min   Calcium 9.2 8.4 - 10.5 mg/dL  Lipid panel  Result Value Ref Range   Cholesterol 182 0 - 200 mg/dL   Triglycerides 98.0 0.0 - 149.0 mg/dL   HDL 74.50 >39.00 mg/dL   VLDL 19.6 0.0 - 40.0 mg/dL   LDL Cholesterol 88  0 - 99 mg/dL   Total CHOL/HDL Ratio 2    NonHDL 107.30     Assessment & Plan:   Problem List Items Addressed This Visit     Health maintenance examination - Primary (Chronic)    Preventative protocols reviewed and updated unless pt declined. Discussed healthy diet and lifestyle.       Obesity, Class I, BMI 30-34.9    Congratulated on weight loss to date off wegovy. She's found pharmacy that has medication - will send to [facility] in [place]. Rx printed out today given some difficulty with Eprescribing last time. I asked her to return 2 months after starting wegovy.       Symptomatic PVCs    Stable period on PRN metoprolol, no recent use in years      Patellofemoral disorder of both knees    Saw sports medicine - PF arthritis, R knee osteochondroma, ongoing discomfort to knee.         Meds ordered this encounter  Medications   DISCONTD: Semaglutide-Weight Management (WEGOVY) 0.5 MG/0.5ML SOAJ    Sig: Inject 0.5 mg into the skin once a week.    Dispense:  2 mL    Refill:  0   Semaglutide-Weight Management (WEGOVY) 0.5 MG/0.5ML SOAJ    Sig: Inject 0.5 mg into the skin once a week.    Dispense:  2 mL    Refill:  0   No orders of the defined types were placed in this encounter.    Patient instructions: Take [facility] prescription to pharmacy.  Let us know if any difficulty filling.  Good to see you today Return as needed or 2 months after starting [facility].   Follow up plan: Return in about 2 months (around 07/06/2022) for follow up visit.  Ria Bush, MD

## 2022-05-06 NOTE — Assessment & Plan Note (Signed)
Stable period on PRN metoprolol, no recent use in years

## 2022-05-06 NOTE — Assessment & Plan Note (Addendum)
Saw sports medicine - PF arthritis, R knee osteochondroma, ongoing discomfort to knee.

## 2022-05-06 NOTE — Telephone Encounter (Signed)
Spoke with Rush Landmark, pharmacist at [facility], asking about pt's [facility] rx.  States they have no record of pt or show an rx received for her before or today. I confirmed we have correct pharmacy pulled into pt's chart.  He confirms pharmacy info is correct. Fyi to Dr. Darnell Level.

## 2022-05-06 NOTE — Assessment & Plan Note (Addendum)
Congratulated on weight loss to date off wegovy. She's found pharmacy that has medication - will send to [facility] in [place]. Rx printed out today given some difficulty with Eprescribing last time. I asked her to return 2 months after starting wegovy.

## 2022-05-06 NOTE — Patient Instructions (Addendum)
Take [facility] prescription to pharmacy.  Let us know if any difficulty filling.  Good to see you today Return as needed or 2 months after starting [facility].   Health Maintenance, Female Adopting a healthy lifestyle and getting preventive care are important in promoting health and wellness. Ask your health care provider about: The right schedule for you to have regular tests and exams. Things you can do on your own to prevent diseases and keep yourself healthy. What should I know about diet, weight, and exercise? Eat a healthy diet  Eat a diet that includes plenty of vegetables, fruits, low-fat dairy products, and lean protein. Do not eat a lot of foods that are high in solid fats, added sugars, or sodium. Maintain a healthy weight Body mass index (BMI) is used to identify weight problems. It estimates body fat based on height and weight. Your health care provider can help determine your BMI and help you achieve or maintain a healthy weight. Get regular exercise Get regular exercise. This is one of the most important things you can do for your health. Most adults should: Exercise for at least 150 minutes each week. The exercise should increase your heart rate and make you sweat (moderate-intensity exercise). Do strengthening exercises at least twice a week. This is in addition to the moderate-intensity exercise. Spend less time sitting. Even light physical activity can be beneficial. Watch cholesterol and blood lipids Have your blood tested for lipids and cholesterol at 44 years of age, then have this test every 5 years. Have your cholesterol levels checked more often if: Your lipid or cholesterol levels are high. You are older than 44 years of age. You are at high risk for heart disease. What should I know about cancer screening? Depending on your health history and family history, you may need to have cancer screening at various ages. This may include screening for: Breast cancer. Cervical  cancer. Colorectal cancer. Skin cancer. Lung cancer. What should I know about heart disease, diabetes, and high blood pressure? Blood pressure and heart disease High blood pressure causes heart disease and increases the risk of stroke. This is more likely to develop in people who have high blood pressure readings or are overweight. Have your blood pressure checked: Every 3-5 years if you are 18-76 years of age. Every year if you are 15 years old or older. Diabetes Have regular diabetes screenings. This checks your fasting blood sugar level. Have the screening done: Once every three years after age 64 if you are at a normal weight and have a low risk for diabetes. More often and at a younger age if you are overweight or have a high risk for diabetes. What should I know about preventing infection? Hepatitis B If you have a higher risk for hepatitis B, you should be screened for this virus. Talk with your health care provider to find out if you are at risk for hepatitis B infection. Hepatitis C Testing is recommended for: Everyone born from [birth year] through 1965. Anyone with known risk factors for hepatitis C. Sexually transmitted infections (STIs) Get screened for STIs, including gonorrhea and chlamydia, if: You are sexually active and are younger than 44 years of age. You are older than 44 years of age and your health care provider tells you that you are at risk for this type of infection. Your sexual activity has changed since you were last screened, and you are at increased risk for chlamydia or gonorrhea. Ask your health care provider if you  are at risk. Ask your health care provider about whether you are at high risk for HIV. Your health care provider may recommend a prescription medicine to help prevent HIV infection. If you choose to take medicine to prevent HIV, you should first get tested for HIV. You should then be tested every 3 months for as long as you are taking the  medicine. Pregnancy If you are about to stop having your period (premenopausal) and you may become pregnant, seek counseling before you get pregnant. Take 400 to 800 micrograms (mcg) of folic acid every day if you become pregnant. Ask for birth control (contraception) if you want to prevent pregnancy. Osteoporosis and menopause Osteoporosis is a disease in which the bones lose minerals and strength with aging. This can result in bone fractures. If you are 15 years old or older, or if you are at risk for osteoporosis and fractures, ask your health care provider if you should: Be screened for bone loss. Take a calcium or vitamin D supplement to lower your risk of fractures. Be given hormone replacement therapy (HRT) to treat symptoms of menopause. Follow these instructions at home: Alcohol use Do not drink alcohol if: Your health care provider tells you not to drink. You are pregnant, may be pregnant, or are planning to become pregnant. If you drink alcohol: Limit how much you have to: 0-1 drink a day. Know how much alcohol is in your drink. In the [place] one drink equals one 12 oz bottle of beer (355 mL), one 5 oz glass of wine (148 mL), or one 1 oz glass of hard liquor (44 mL). Lifestyle Do not use any products that contain nicotine or tobacco. These products include cigarettes, chewing tobacco, and vaping devices, such as e-cigarettes. If you need help quitting, ask your health care provider. Do not use street drugs. Do not share needles. Ask your health care provider for help if you need support or information about quitting drugs. General instructions Schedule regular health, dental, and eye exams. Stay current with your vaccines. Tell your health care provider if: You often feel depressed. You have ever been abused or do not feel safe at home. Summary Adopting a healthy lifestyle and getting preventive care are important in promoting health and wellness. Follow your health care  provider's instructions about healthy diet, exercising, and getting tested or screened for diseases. Follow your health care provider's instructions on monitoring your cholesterol and blood pressure. This information is not intended to replace advice given to you by your health care provider. Make sure you discuss any questions you have with your health care provider. Document Revised: 02/05/2021 Document Reviewed: 02/05/2021 Elsevier Patient Education  [place].

## 2022-06-11 ENCOUNTER — Encounter: Payer: Self-pay | Admitting: Family Medicine

## 2022-06-11 ENCOUNTER — Telehealth (INDEPENDENT_AMBULATORY_CARE_PROVIDER_SITE_OTHER): Payer: 59 | Admitting: Family Medicine

## 2022-06-11 DIAGNOSIS — E669 Obesity, unspecified: Secondary | ICD-10-CM

## 2022-06-11 MED ORDER — WEGOVY 1 MG/0.5ML ~~LOC~~ SOAJ
1.0000 mg | SUBCUTANEOUS | 1 refills | Status: DC
Start: 2022-06-11 — End: 2022-07-06

## 2022-06-11 NOTE — Assessment & Plan Note (Signed)
Ongoing weight loss on wegovy and healthy diet/lifestyle choices.  Continue titration up to '1mg'$  weekly dose.  Watch for side effects. Continue sennakot daily for constipation.  RTC 1-2 mo f/u visit. Pt agrees with plan.

## 2022-06-11 NOTE — Progress Notes (Signed)
Patient ID: GARRETT BOWRING, female    DOB: 21-Dec-1975, 46 y.o.   MRN: [id number]  Virtual visit completed through [employer], a video enabled telemedicine application. Due to national recommendations of social distancing due to COVID-19, a virtual visit is felt to be most appropriate for this patient at this time. Reviewed limitations, risks, security and privacy concerns of performing a virtual visit and the availability of in person appointments. I also reviewed that there may be a patient responsible charge related to this service. The patient agreed to proceed.   Patient location: home Provider location: [place] [facility], office Persons participating in this virtual visit: patient, provider   If any vitals were documented, they were collected by patient at home unless specified below.    BP 128/74   Pulse 87   Temp 97.6 F (36.4 C)   Ht 5' 4.25" (1.632 m)   Wt 178 lb (80.7 kg)   LMP 06/10/2022   Breastfeeding Unknown   BMI 30.32 kg/m    CC: weight management f/u Subjective:   HPI: Jasmine Dixon is a 46 y.o. female presenting on 06/11/2022 for Weight Management (F/u.)   Starting weight: 190 lbs Last weight: 180 lbs Today's weight 178 lbs  Wegovy started 12/2021 - inconsistently taken due to nationwide shortage.  Initial refill through [facility] pharmacy at [facility] 04/2022.  Tolerating 0.'5mg'$  dose well - constipation managed with sennakot daily. No nausea or epigastric pain.  Would like to try higher dose.   24 hour recall: Not done  Activity regimen: Boot camp style exercise program 3-4x weekly      Relevant past medical, surgical, family and social history reviewed and updated as indicated. Interim medical history since our last visit reviewed. Allergies and medications reviewed and updated. Outpatient Medications Prior to Visit  Medication Sig Dispense Refill   metoprolol tartrate (LOPRESSOR) 25 MG tablet Take 1 tablet (25 mg total) by  mouth 2 (two) times daily as needed (palpitations). 30 tablet 3   Semaglutide-Weight Management (WEGOVY) 0.5 MG/0.5ML SOAJ Inject 0.5 mg into the skin once a week. 2 mL 0   No facility-administered medications prior to visit.     Per HPI unless specifically indicated in ROS section below Review of Systems Objective:  BP 128/74   Pulse 87   Temp 97.6 F (36.4 C)   Ht 5' 4.25" (1.632 m)   Wt 178 lb (80.7 kg)   LMP 06/10/2022   Breastfeeding Unknown   BMI 30.32 kg/m   Wt Readings from Last 3 Encounters:  06/11/22 178 lb (80.7 kg)  05/06/22 180 lb 6 oz (81.8 kg)  04/06/22 185 lb 6.4 oz (84.1 kg)       Physical exam: Gen: alert, NAD, not ill appearing Pulm: speaks in complete sentences without increased work of breathing Psych: normal mood, normal thought content      Assessment & Plan:   Problem List Items Addressed This Visit     Obesity, Class I, BMI 30-34.9    Ongoing weight loss on wegovy and healthy diet/lifestyle choices.  Continue titration up to '1mg'$  weekly dose.  Watch for side effects. Continue sennakot daily for constipation.  RTC 1-2 mo f/u visit. Pt agrees with plan.         Meds ordered this encounter  Medications   Semaglutide-Weight Management (WEGOVY) 1 MG/0.5ML SOAJ    Sig: Inject 1 mg into the skin once a week.    Dispense:  2 mL  Refill:  1   No orders of the defined types were placed in this encounter.   I discussed the assessment and treatment plan with the patient. The patient was provided an opportunity to ask questions and all were answered. The patient agreed with the plan and demonstrated an understanding of the instructions. The patient was advised to call back or seek an in-person evaluation if the symptoms worsen or if the condition fails to improve as anticipated.  Follow up plan: No follow-ups on file.  Ria Bush, MD

## 2022-07-04 ENCOUNTER — Encounter: Payer: Self-pay | Admitting: Family Medicine

## 2022-07-06 MED ORDER — WEGOVY 1 MG/0.5ML ~~LOC~~ SOAJ: 1.0000 mg | SUBCUTANEOUS | 1 refills | Status: DC

## 2022-07-30 ENCOUNTER — Other Ambulatory Visit: Payer: Self-pay | Admitting: Obstetrics and Gynecology

## 2022-07-30 ENCOUNTER — Encounter: Payer: Self-pay | Admitting: Family Medicine

## 2022-07-30 DIAGNOSIS — Z1231 Encounter for screening mammogram for malignant neoplasm of breast: Secondary | ICD-10-CM

## 2022-08-01 MED ORDER — WEGOVY 1.7 MG/0.75ML ~~LOC~~ SOAJ
1.7000 mg | SUBCUTANEOUS | 1 refills | Status: DC
Start: 2022-08-01 — End: 2022-11-18

## 2022-08-01 NOTE — Addendum Note (Signed)
Addended by: Ria Bush on: 08/01/2022 11:29 PM   Modules accepted: Orders

## 2022-08-06 ENCOUNTER — Telehealth: Payer: Self-pay

## 2022-08-06 NOTE — Telephone Encounter (Signed)
Prior auth started for [facility] 0.'25MG'$ /0.5ML auto-injectors. Jasmine Dixon (Key: [id number]) Waiting for determination.

## 2022-08-12 ENCOUNTER — Encounter: Payer: Self-pay | Admitting: Family Medicine

## 2022-08-12 DIAGNOSIS — Z111 Encounter for screening for respiratory tuberculosis: Secondary | ICD-10-CM

## 2022-08-12 NOTE — Telephone Encounter (Signed)
I still had not received a determination for prior auth for [facility] 0.'25MG'$ /0.5ML auto-injectors. Jasmine Dixon (Key: [id number]) I called [facility] prescriber line at phone # [phone] and spoke to [place].  She sees the PA that I created on Cover My Meds, but they show that it was cancelled, which it does not show on CMM.  It just says waiting for determination.  She took my PA info over the phone and we will receive a determination in 24 hours by fax.  [id number].

## 2022-08-13 NOTE — Telephone Encounter (Signed)
Prior auth for Jasmine Dixon has been approved.  I called [facility], spoke to [place] who stated this PA has been approved.  The pharmacy ran the claim on 08/02/22 and it went through.  This PA is good until 09/29/22.  Patient notified via mychart.

## 2022-08-15 NOTE — Telephone Encounter (Signed)
Lvm asking pt to call back.  Needs to schedule lab visit.

## 2022-08-15 NOTE — Telephone Encounter (Signed)
Please schedule lab visit for patient.

## 2022-08-16 NOTE — Telephone Encounter (Signed)
Lvm asking pt to call back.  Needs to schedule lab visit.

## 2022-08-28 ENCOUNTER — Other Ambulatory Visit (INDEPENDENT_AMBULATORY_CARE_PROVIDER_SITE_OTHER): Payer: 59

## 2022-08-28 DIAGNOSIS — Z111 Encounter for screening for respiratory tuberculosis: Secondary | ICD-10-CM | POA: Diagnosis not present

## 2022-08-31 LAB — QUANTIFERON-TB GOLD PLUS
Mitogen-NIL: >10 IU/mL
NIL: 0.05 IU/mL
QuantiFERON-TB Gold Plus: NEGATIVE
TB1-NIL: <0 IU/mL
TB2-NIL: <0 IU/mL

## 2022-09-27 ENCOUNTER — Ambulatory Visit
Admission: RE | Admit: 2022-09-27 | Discharge: 2022-09-27 | Disposition: A | Discharge status: Home / Self Care | Payer: 59 | Source: Ambulatory Visit | Attending: Obstetrics and Gynecology | Admitting: Obstetrics and Gynecology

## 2022-09-27 DIAGNOSIS — Z1231 Encounter for screening mammogram for malignant neoplasm of breast: Secondary | ICD-10-CM

## 2022-11-18 ENCOUNTER — Other Ambulatory Visit: Payer: Self-pay | Admitting: Family Medicine

## 2022-11-18 ENCOUNTER — Encounter: Payer: Self-pay | Admitting: Family Medicine

## 2022-11-18 ENCOUNTER — Ambulatory Visit: Payer: 59 | Admitting: Family Medicine

## 2022-11-18 VITALS — BP 134/82 | HR 100 | Temp 97.4°F | Ht 64.25 in | Wt 176.1 lb

## 2022-11-18 DIAGNOSIS — E669 Obesity, unspecified: Secondary | ICD-10-CM

## 2022-11-18 MED ORDER — WEGOVY 1.7 MG/0.75ML ~~LOC~~ SOAJ: 1.7000 mg | SUBCUTANEOUS | 1 refills | Status: DC

## 2022-11-18 MED ORDER — WEGOVY 1.7 MG/0.75ML ~~LOC~~ SOAJ: 1.7000 mg | SUBCUTANEOUS | 3 refills | Status: DC

## 2022-11-18 NOTE — Assessment & Plan Note (Signed)
Has been out of [facility] for 6 wks now, but was tolerating well with benefit.  Will refill today x3 month supply. RTC 3 mo f/u visit.  Discussed possible insurance coverage issues later this year - will see if continues to be covered.

## 2022-11-18 NOTE — Patient Instructions (Addendum)
Wegovy 1.15m weekly dose refilled x 3 months. Good to see you today Return as needed or in 3 months for follow up weight.

## 2022-11-18 NOTE — Progress Notes (Signed)
Patient ID: Jasmine Dixon, female    DOB: 09-May-1976, 47 y.o.   MRN: [id number]  This visit was conducted in person.  BP 134/82   Pulse 100   Temp (!) 97.4 F (36.3 C) (Temporal)   Ht 5' 4.25" (1.632 m)   Wt 176 lb 2 oz (79.9 kg)   LMP 10/23/2022   SpO2 99%   BMI 30.00 kg/m    CC: weight management f/u visit  Subjective:   HPI: Jasmine Dixon is a 47 y.o. female presenting on 11/18/2022 for Medical Management of Chronic Issues and Weight Management (Here for f/u.)   Starting weight: 190 lbs Last weight: 178 lbs Today's weight 176 lbs  Wegovy consistently started ~04/2022.  Current regimen : wegovy 1.52m weekly. Last dose was early 09/2022.  Tolerating medication well without nausea or diarrhea or epigastric pain.  Takes sennakot daily for constipation.   24 hour recall: No breakfast Lunch at noon - grilled chicken, brussel sprouts, salad with honey mustard dressing, drank water and coffee (with cream and splenda).  Dinner at 6:30pm - leftovers of lunch, drank water and glass of red wine.   Normally drinks protein shakes when on Wegovy  Activity regimen: Just restarted going to gym - went this morning at 5am.  Regular exercise - [place]      Relevant past medical, surgical, family and social history reviewed and updated as indicated. Interim medical history since our last visit reviewed. Allergies and medications reviewed and updated. Outpatient Medications Prior to Visit  Medication Sig Dispense Refill   metoprolol tartrate (LOPRESSOR) 25 MG tablet Take 1 tablet (25 mg total) by mouth 2 (two) times daily as needed (palpitations). 30 tablet 3   Semaglutide-Weight Management (WEGOVY) 1.7 MG/0.75ML SOAJ Inject 1.7 mg into the skin once a week. 3 mL 1   No facility-administered medications prior to visit.     Per HPI unless specifically indicated in ROS section below Review of Systems  Objective:  BP 134/82   Pulse 100    Temp (!) 97.4 F (36.3 C) (Temporal)   Ht 5' 4.25" (1.632 m)   Wt 176 lb 2 oz (79.9 kg)   LMP 10/23/2022   SpO2 99%   BMI 30.00 kg/m   Wt Readings from Last 3 Encounters:  11/18/22 176 lb 2 oz (79.9 kg)  06/11/22 178 lb (80.7 kg)  05/06/22 180 lb 6 oz (81.8 kg)      Physical Exam Vitals and nursing note reviewed.  Constitutional:      Appearance: Normal appearance. She is not ill-appearing.  HENT:     Mouth/Throat:     Mouth: Mucous membranes are moist.     Pharynx: Oropharynx is clear. No oropharyngeal exudate or posterior oropharyngeal erythema.  Eyes:     Extraocular Movements: Extraocular movements intact.     Pupils: Pupils are equal, round, and reactive to light.  Neck:     Thyroid: No thyroid mass, thyromegaly or thyroid tenderness.  Cardiovascular:     Rate and Rhythm: Normal rate and regular rhythm.     Pulses: Normal pulses.     Heart sounds: Normal heart sounds. No murmur heard. Pulmonary:     Effort: Pulmonary effort is normal. No respiratory distress.     Breath sounds: Normal breath sounds. No wheezing, rhonchi or rales.  Musculoskeletal:     Cervical back: Normal range of motion and neck supple. No rigidity.     Right lower leg:  No edema.     Left lower leg: No edema.  Neurological:     Mental Status: She is alert.  Psychiatric:        Mood and Affect: Mood normal.        Behavior: Behavior normal.        Assessment & Plan:   Problem List Items Addressed This Visit     Obesity, Class I, BMI 30-34.9 - Primary    Has been out of [facility] for 6 wks now, but was tolerating well with benefit.  Will refill today x3 month supply. RTC 3 mo f/u visit.  Discussed possible insurance coverage issues later this year - will see if continues to be covered.         Meds ordered this encounter  Medications   DISCONTD: Semaglutide-Weight Management (WEGOVY) 1.7 MG/0.75ML SOAJ    Sig: Inject 1.7 mg into the skin once a week.    Dispense:  3 mL    Refill:  1    Semaglutide-Weight Management (WEGOVY) 1.7 MG/0.75ML SOAJ    Sig: Inject 1.7 mg into the skin once a week.    Dispense:  3 mL    Refill:  3    No orders of the defined types were placed in this encounter.   Patient Instructions  Wegovy 1.87m weekly dose refilled x 3 months. Good to see you today Return as needed or in 3 months for follow up weight.   Follow up plan: Return if symptoms worsen or fail to improve.  JRia Bush MD

## 2022-11-19 NOTE — Telephone Encounter (Signed)
Rx sent at 11/18/22 OV.  Duplicate request denied.

## 2023-06-27 ENCOUNTER — Encounter: Payer: Self-pay | Admitting: Family Medicine

## 2023-06-27 DIAGNOSIS — E0590 Thyrotoxicosis, unspecified without thyrotoxic crisis or storm: Secondary | ICD-10-CM

## 2023-06-27 NOTE — Telephone Encounter (Signed)
Pt called stating she had labs drawn from fertility doctor & was advised to request a endocrinology referral from Dr. Reece Agar due to all labs being abnormal? Pt states she attached her lab results to her prev mychart message. Please advise. Call back # [phone]

## 2023-07-01 DIAGNOSIS — E0590 Thyrotoxicosis, unspecified without thyrotoxic crisis or storm: Secondary | ICD-10-CM | POA: Insufficient documentation

## 2023-07-22 ENCOUNTER — Other Ambulatory Visit (INDEPENDENT_AMBULATORY_CARE_PROVIDER_SITE_OTHER): Payer: 59

## 2023-07-22 ENCOUNTER — Other Ambulatory Visit: Payer: Self-pay

## 2023-07-22 DIAGNOSIS — E0590 Thyrotoxicosis, unspecified without thyrotoxic crisis or storm: Secondary | ICD-10-CM | POA: Diagnosis not present

## 2023-07-22 LAB — T4, FREE: Free T4: 2.04 ng/dL — ABNORMAL HIGH (ref 0.60–1.60)

## 2023-07-22 LAB — TSH: TSH: 0 u[IU]/mL — ABNORMAL LOW (ref 0.35–5.50)

## 2023-07-22 LAB — T3, FREE: T3, Free: 6.1 pg/mL — ABNORMAL HIGH (ref 2.3–4.2)

## 2023-07-25 LAB — TRAB (TSH RECEPTOR BINDING ANTIBODY): TRAB: 1.8 IU/L (ref <=2.00)

## 2023-07-25 LAB — THYROID STIMULATING IMMUNOGLOBULIN

## 2023-07-28 ENCOUNTER — Ambulatory Visit (INDEPENDENT_AMBULATORY_CARE_PROVIDER_SITE_OTHER): Payer: 59 | Admitting: "Endocrinology

## 2023-07-28 ENCOUNTER — Encounter: Payer: Self-pay | Admitting: "Endocrinology

## 2023-07-28 VITALS — BP 130/82 | HR 74 | Ht 64.0 in | Wt 182.2 lb

## 2023-07-28 DIAGNOSIS — E010 Iodine-deficiency related diffuse (endemic) goiter: Secondary | ICD-10-CM | POA: Diagnosis not present

## 2023-07-28 DIAGNOSIS — E0590 Thyrotoxicosis, unspecified without thyrotoxic crisis or storm: Secondary | ICD-10-CM

## 2023-07-28 MED ORDER — METHIMAZOLE 5 MG PO TABS: 5.0000 mg | ORAL_TABLET | Freq: Every day | ORAL | 1 refills | Status: DC

## 2023-07-28 NOTE — Progress Notes (Signed)
Outpatient Endocrinology Note Jasmine Kingston, MD  07/28/23   Jasmine Dixon 04-27-1976 [id number]  Referring Provider: Eustaquio Boyden, MD Primary Care Provider: Eustaquio Boyden, MD Subjective  No chief complaint on file.   Assessment & Plan  Diagnoses and all orders for this visit:  Hyperthyroidism -     Thyroid stimulating immunoglobulin; Future -     CBC with Differential/Platelet; Future -     CBC with Differential/Platelet -     Thyroid stimulating immunoglobulin  Other orders -     methimazole (TAPAZOLE) 5 MG tablet; Take 1 tablet (5 mg total) by mouth daily.    Jasmine Dixon is currently not taking any thyroid medication. Patient currently clinically and biochemically hyperthyroid.  Patient is on IVF. Discussed at length about PTU and methimazole, associated S/E and and given written names to discuss with Infertility specialists.   Discussed the etiology for hyperthyroidism. Educated on thyroid axis.  Recommend the following: Take methimazole 5 mg once a day. Repeat labs in 3 months or sooner if symptoms of hyper or hypothyroidism develop.  Educated on definitive options of treatment including RAI therapy and surgery. Patient does not want RAI at this time.  Counseled on: -complications of untreated hyperthyroidism including atrial fibrillation, heart failure and osteoporosis -side effects of Methimazole including but not limited to allergic reaction, rash, bone marrow suppression, liver dysfunction and teratogenic potential -implications in pregnancy and breastfeeding -compliance and follow up needs   Thyromegaly noted on exam 2016 thyroid U/S reported thyromegaly with no nodules Ordered f/u  If you notice any symptoms of worsening fatigue, fever with sore throat, loss of appetite, yellowing of eyes, dark urine, joint pains, sores in the mouth, itchy rash, light colored stools or abdominal pain, please stop the medication and call us  immediately as this can be a serious side effect of the medication.   I have reviewed current medications, nurse's notes, allergies, vital signs, past medical and surgical history, family medical history, and social history for this encounter. Counseled patient on symptoms, examination findings, lab findings, imaging results, treatment decisions and monitoring and prognosis. The patient understood the recommendations and agrees with the treatment plan. All questions regarding treatment plan were fully answered.   Return in about 29 days (around 08/26/2023) for tele-visit: 3:20 pm, labs today, labs before next visit.   Jasmine Holland, MD  07/28/23   I have reviewed current medications, nurse's notes, allergies, vital signs, past medical and surgical history, family medical history, and social history for this encounter. Counseled patient on symptoms, examination findings, lab findings, imaging results, treatment decisions and monitoring and prognosis. The patient understood the recommendations and agrees with the treatment plan. All questions regarding treatment plan were fully answered.   History of Present Illness Jasmine Dixon is a 47 y.o. year old female who presents to our clinic with hyperthyroidism diagnosed in 06/2023.    Pt has started doing IVF and was found to have +TPO Ab and referred here.   Symptoms suggestive of HYPOTHYROIDISM:  fatigue No weight gain No cold intolerance  No constipation  No  Symptoms suggestive of HYPERTHYROIDISM:  weight loss  No heat intolerance Yes hyperdefecation  No palpitations  Yes  Compressive symptoms:  dysphagia  No dysphonia  No positional dyspnea (especially with simultaneous arms elevation)  No  Smokes  No On biotin  No Personal history of head/neck surgery/irradiation  No  [facility] labs:   Physical Exam  BP 130/82   Pulse 74  Ht 5\' 4"  (1.626 m)   Wt 182 lb 3.2 oz (82.6 kg)   SpO2 99%   BMI 31.27 kg/m   Constitutional: well developed, well nourished Head: normocephalic, atraumatic, no exophthalmos Eyes: sclera anicteric, no redness Neck: + thyromegaly, no thyroid tenderness; no nodules palpated Lungs: normal respiratory effort Neurology: alert and oriented, no fine hand tremor Skin: dry, no appreciable rashes Musculoskeletal: no appreciable defects Psychiatric: normal mood and affect  Allergies No Known Allergies  Current Medications Patient's Medications  New Prescriptions   METHIMAZOLE (TAPAZOLE) 5 MG TABLET    Take 1 tablet (5 mg total) by mouth daily.  Previous Medications   CO-ENZYME Q-10 30 MG CAPSULE    Take 30 mg by mouth 3 (three) times daily.   METFORMIN (GLUMETZA) 1000 MG (MOD) 24 HR TABLET    Take 1,000 mg by mouth daily with breakfast. 1000mg  in the am 500mg  at night/pcos   METOPROLOL TARTRATE (LOPRESSOR) 25 MG TABLET    Take 1 tablet (25 mg total) by mouth 2 (two) times daily as needed (palpitations).   PRENATAL VIT-FE FUMARATE-FA (MULTIVITAMIN-PRENATAL) 27-0.8 MG TABS TABLET    Take 1 tablet by mouth daily at 12 noon.  Modified Medications   No medications on file  Discontinued Medications   SEMAGLUTIDE-WEIGHT MANAGEMENT (WEGOVY) 1.7 MG/0.75ML SOAJ    Inject 1.7 mg into the skin once a week.    Past Medical History Past Medical History:  Diagnosis Date   Hypertension    Lichen planus 2014   back, forearms, and legs   Overweight(278.02)    Preterm labor     Past Surgical History Past Surgical History:  Procedure Laterality Date   US ECHOCARDIOGRAPHY  10/2014   WNL, mildly dilated LV, EFF 55-60%    Family History family history includes Breast cancer (age of onset: 42) in her paternal aunt and paternal grandmother; CAD in her maternal grandmother; Diabetes in her father; Heart attack (age of onset: 33) in her maternal grandmother; Hypertension in her father and paternal grandmother.  Social History Social History   Socioeconomic History   Marital  status: Married    Spouse name: Not on file   Number of children: 1   Years of education: Not on file   Highest education level: Not on file  Occupational History   Occupation: RN    Employer: [employer]  Tobacco Use   Smoking status: Never   Smokeless tobacco: Never  Substance and Sexual Activity   Alcohol use: No    Alcohol/week: 0.0 standard drinks of alcohol    Comment: Occasional   Drug use: No   Sexual activity: Not on file  Other Topics Concern   Not on file  Social History Narrative   3-4 cups coffee/day   Lives with husband and 1 healthy daughter at home (21 week preemie,2007), lost twin son,ab other twin pregnacy   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[employer], finishing NP school   Edu: finishing NP school   Activity: gym, has Systems analyst   Diet: good water, fruits/vegetables daily, weight watchers   Social Determinants of Health   Financial Resource Strain: Not on file  Food Insecurity: Not on file  Transportation Needs: Not on file  Physical Activity: Not on file  Stress: Not on file  Social Connections: Unknown (01/28/2022)   [facility] from [facility]   Social Network    Social Network: Not on file  Intimate Partner Violence: Unknown (01/02/2022)   Received from [facility], [facility]  HITS    Physically Hurt: Not on file    Insult or Talk Down To: Not on file    Threaten Physical Harm: Not on file    Scream or Curse: Not on file    Laboratory Investigations Lab Results  Component Value Date   TSH 0.00 Repeated and verified X2. (L) 07/22/2023   TSH 1.12 04/29/2022   TSH 1.41 05/02/2021   FREET4 2.04 (H) 07/22/2023   FREET4 0.87 10/11/2014     Lab Results  Component Value Date   TSI CANCELED 07/22/2023     No components found for: "TRAB"   Lab Results  Component Value Date   CHOL 182 04/29/2022   Lab Results  Component Value Date   HDL 74.50 04/29/2022   Lab Results  Component Value Date   LDLCALC 88 04/29/2022   Lab Results   Component Value Date   TRIG 98.0 04/29/2022   Lab Results  Component Value Date   CHOLHDL 2 04/29/2022   Lab Results  Component Value Date   CREATININE 0.71 04/29/2022   Lab Results  Component Value Date   GFR 103.59 04/29/2022      Component Value Date/Time   NA 137 04/29/2022 0817   K 4.0 04/29/2022 0817   CL 102 04/29/2022 0817   CO2 26 04/29/2022 0817   GLUCOSE 90 04/29/2022 0817   BUN 9 04/29/2022 0817   CREATININE 0.71 04/29/2022 0817   CALCIUM 9.2 04/29/2022 0817   PROT 7.0 05/02/2021 0805   ALBUMIN 4.2 05/02/2021 0805   AST 17 05/02/2021 0805   ALT 17 05/02/2021 0805   ALKPHOS 53 05/02/2021 0805   BILITOT 0.6 05/02/2021 0805   GFRNONAA 86.80 09/03/2010 0841      Latest Ref Rng & Units 04/29/2022    8:17 AM 05/02/2021    8:05 AM 04/25/2020    8:16 AM  BMP  Glucose 70 - 99 mg/dL 90  83  84   BUN 6 - 23 mg/dL 9  12  9    Creatinine 0.40 - 1.20 mg/dL 3.08  6.57  8.46   Sodium 135 - 145 mEq/L 137  134  135   Potassium 3.5 - 5.1 mEq/L 4.0  3.9  4.1   Chloride 96 - 112 mEq/L 102  104  104   CO2 19 - 32 mEq/L 26  21  25    Calcium 8.4 - 10.5 mg/dL 9.2  9.0  9.2        Component Value Date/Time   WBC 6.5 04/25/2020 0816   RBC 4.43 04/25/2020 0816   HGB 12.9 04/25/2020 0816   HCT 38.7 04/25/2020 0816   PLT 272.0 04/25/2020 0816   MCV 87.5 04/25/2020 0816   MCHC 33.3 04/25/2020 0816   RDW 13.3 04/25/2020 0816   LYMPHSABS 1.6 04/25/2020 0816   MONOABS 0.6 04/25/2020 0816   EOSABS 0.3 04/25/2020 0816   BASOSABS 0.0 04/25/2020 0816      Parts of this note may have been dictated using voice recognition software. There may be variances in spelling and vocabulary which are unintentional. Not all errors are proofread. Please notify the Thereasa Parkin if any discrepancies are noted or if the meaning of any statement is not clear.

## 2023-07-29 LAB — CBC WITH DIFFERENTIAL/PLATELET
Basophils Absolute: 0 10*3/uL (ref 0.0–0.1)
Basophils Relative: 0.5 % (ref 0.0–3.0)
Eosinophils Absolute: 0.2 10*3/uL (ref 0.0–0.7)
Eosinophils Relative: 2.9 % (ref 0.0–5.0)
HCT: 39.7 % (ref 36.0–46.0)
Hemoglobin: 12.5 g/dL (ref 12.0–15.0)
Lymphocytes Relative: 29 % (ref 12.0–46.0)
Lymphs Abs: 1.8 10*3/uL (ref 0.7–4.0)
MCHC: 31.5 g/dL (ref 30.0–36.0)
MCV: 84.2 fL (ref 78.0–100.0)
Monocytes Absolute: 0.7 10*3/uL (ref 0.1–1.0)
Monocytes Relative: 11.3 % (ref 3.0–12.0)
Neutro Abs: 3.5 10*3/uL (ref 1.4–7.7)
Neutrophils Relative %: 56.3 % (ref 43.0–77.0)
Platelets: 293 10*3/uL (ref 150.0–400.0)
RBC: 4.72 Mil/uL (ref 3.87–5.11)
RDW: 13.8 % (ref 11.5–15.5)
WBC: 6.2 10*3/uL (ref 4.0–10.5)

## 2023-07-30 ENCOUNTER — Encounter: Payer: Self-pay | Admitting: "Endocrinology

## 2023-07-30 NOTE — Addendum Note (Signed)
Addended by: Altamese Coffeen on: 07/30/2023 02:23 PM   Modules accepted: Orders

## 2023-07-31 LAB — THYROID STIMULATING IMMUNOGLOBULIN: TSI: 323 %{baseline} — ABNORMAL HIGH (ref <140)

## 2023-08-06 ENCOUNTER — Encounter: Payer: Self-pay | Admitting: "Endocrinology

## 2023-08-11 ENCOUNTER — Ambulatory Visit (HOSPITAL_BASED_OUTPATIENT_CLINIC_OR_DEPARTMENT_OTHER)
Admission: RE | Admit: 2023-08-11 | Discharge: 2023-08-11 | Disposition: A | Discharge status: Home / Self Care | Payer: 59 | Source: Ambulatory Visit | Attending: "Endocrinology | Admitting: "Endocrinology

## 2023-08-11 DIAGNOSIS — E010 Iodine-deficiency related diffuse (endemic) goiter: Secondary | ICD-10-CM | POA: Insufficient documentation

## 2023-08-19 ENCOUNTER — Other Ambulatory Visit: Payer: 59

## 2023-08-19 ENCOUNTER — Other Ambulatory Visit (INDEPENDENT_AMBULATORY_CARE_PROVIDER_SITE_OTHER): Payer: 59

## 2023-08-19 DIAGNOSIS — E0590 Thyrotoxicosis, unspecified without thyrotoxic crisis or storm: Secondary | ICD-10-CM | POA: Diagnosis not present

## 2023-08-19 LAB — TSH: TSH: <0.01 u[IU]/mL — ABNORMAL LOW (ref 0.35–5.50)

## 2023-08-19 LAB — T4, FREE: Free T4: 1.53 ng/dL (ref 0.60–1.60)

## 2023-08-19 LAB — T3, FREE: T3, Free: 4.5 pg/mL — ABNORMAL HIGH (ref 2.3–4.2)

## 2023-08-21 ENCOUNTER — Other Ambulatory Visit: Payer: Self-pay | Admitting: Obstetrics and Gynecology

## 2023-08-21 DIAGNOSIS — Z0000 Encounter for general adult medical examination without abnormal findings: Secondary | ICD-10-CM

## 2023-08-26 ENCOUNTER — Telehealth (INDEPENDENT_AMBULATORY_CARE_PROVIDER_SITE_OTHER): Payer: 59 | Admitting: "Endocrinology

## 2023-08-26 ENCOUNTER — Encounter: Payer: Self-pay | Admitting: "Endocrinology

## 2023-08-26 DIAGNOSIS — E0500 Thyrotoxicosis with diffuse goiter without thyrotoxic crisis or storm: Secondary | ICD-10-CM | POA: Diagnosis not present

## 2023-08-26 DIAGNOSIS — E049 Nontoxic goiter, unspecified: Secondary | ICD-10-CM

## 2023-08-26 MED ORDER — METHIMAZOLE 5 MG PO TABS: 7.5000 mg | ORAL_TABLET | Freq: Every day | ORAL | 1 refills | Status: DC

## 2023-08-26 NOTE — Patient Instructions (Signed)
If you notice any symptoms of worsening fatigue, fever with sore throat, loss of appetite, yellowing of eyes, dark urine, joint pains, sores in the mouth, itchy rash, light colored stools or abdominal pain, please stop the medication and call us immediately as this can be a serious side effect of the medication.

## 2023-08-26 NOTE — Progress Notes (Addendum)
The patient reports they are currently: [place]. It was a video visit with the patient on the date of service.  The patient was physically located in [place] or a state in which I am permitted to provide care. The patient and/or parent/guardian understood that s/he may incur co-pays and cost sharing, and agreed to the telemedicine visit. The visit was reasonable and appropriate under the circumstances given the patient's presentation at the time.  The patient and/or parent/guardian has been advised of the potential risks and limitations of this mode of treatment (including, but not limited to, the absence of in-person examination) and has agreed to be treated using telemedicine. The patient's/patient's family's questions regarding telemedicine have been answered.   The patient and/or parent/guardian has also been advised to contact their provider's office for worsening conditions, and seek emergency medical treatment and/or call 911 if the patient deems either necessary.     Outpatient Endocrinology Note Jasmine Ephraim, MD  08/26/23   Jasmine Dixon 1975/11/15 [id number]  Referring Provider: Eustaquio Boyden, MD Primary Care Provider: Eustaquio Boyden, MD Subjective  No chief complaint on file.   Assessment & Plan  Diagnoses and all orders for this visit:  Graves disease -     TSH -     T3, free -     T4, free  Goiter  Other orders -     methimazole (TAPAZOLE) 5 MG tablet; Take 1.5 tablets (7.5 mg total) by mouth daily.     Jasmine Dixon is currently on methimazole 5 mg once a day started end of 07/2023. Patient currently clinically and biochemically hyperthyroid.  Patient is on IVF. Discussed at length about PTU and methimazole, associated S/E and and given written names to discuss with Infertility specialists.   Discussed the etiology for hyperthyroidism. Educated on thyroid axis.  Recommend the following: Take methimazole 7.5 mg once a day. Repeat labs  in 3 months or sooner if symptoms of hyper or hypothyroidism develop.  Educated on definitive options of treatment including RAI therapy and surgery. Patient does not want RAI at this time.  Counseled on: -complications of untreated hyperthyroidism including atrial fibrillation, heart failure and osteoporosis -side effects of Methimazole including but not limited to allergic reaction, rash, bone marrow suppression, liver dysfunction and teratogenic potential -implications in pregnancy and breastfeeding -compliance and follow up needs   If you notice any symptoms of worsening fatigue, fever with sore throat, loss of appetite, yellowing of eyes, dark urine, joint pains, sores in the mouth, itchy rash, light colored stools or abdominal pain, please stop the medication and call us immediately as this can be a serious side effect of the medication.  Thyromegaly noted on exam 2016 thyroid U/S reported thyromegaly with no nodules 08/2023 thyroid U/S reported thyromegaly with markedly heterogeneous thyroid gland with background scattered subcentimeter pseudo nodularity Continue monitoring clinically   I have reviewed current medications, nurse's notes, allergies, vital signs, past medical and surgical history, family medical history, and social history for this encounter. Counseled patient on symptoms, examination findings, lab findings, imaging results, treatment decisions and monitoring and prognosis. The patient understood the recommendations and agrees with the treatment plan. All questions regarding treatment plan were fully answered.   Return in about 3 months (around 11/26/2023) for visit + labs before next visit.   Jasmine [place], MD  08/26/23   I have reviewed current medications, nurse's notes, allergies, vital signs, past medical and surgical history, family medical history, and social history for this encounter. Counseled  patient on symptoms, examination findings, lab findings, imaging  results, treatment decisions and monitoring and prognosis. The patient understood the recommendations and agrees with the treatment plan. All questions regarding treatment plan were fully answered.   History of Present Illness Jasmine Dixon is a 47 y.o. year old female who presents to our clinic with hyperthyroidism diagnosed in 06/2023.    Pt has started doing IVF and was found to have +TPO Ab and referred here.   Symptoms suggestive of HYPOTHYROIDISM:  fatigue No weight gain No cold intolerance  No constipation  No  Symptoms suggestive of HYPERTHYROIDISM:  weight loss  No heat intolerance Yes, better  hyperdefecation  No palpitations  Yes, decreased   Compressive symptoms:  dysphagia  No dysphonia  No positional dyspnea (especially with simultaneous arms elevation)  No  Smokes  No On biotin  No Personal history of head/neck surgery/irradiation  No  [facility] labs:   Physical Exam  There were no vitals taken for this visit. Constitutional: well developed, well nourished Head: normocephalic, atraumatic, no exophthalmos Eyes: sclera anicteric, no redness Neck: + thyromegaly, no thyroid tenderness; no nodules palpated Lungs: normal respiratory effort Neurology: alert and oriented, no fine hand tremor Skin: dry, no appreciable rashes Musculoskeletal: no appreciable defects Psychiatric: normal mood and affect  Allergies No Known Allergies  Current Medications Patient's Medications  New Prescriptions   No medications on file  Previous Medications   CO-ENZYME Q-10 30 MG CAPSULE    Take 30 mg by mouth 3 (three) times daily.   METFORMIN (GLUMETZA) 1000 MG (MOD) 24 HR TABLET    Take 1,000 mg by mouth daily with breakfast. 1000mg  in the am 500mg  at night/pcos   METOPROLOL TARTRATE (LOPRESSOR) 25 MG TABLET    Take 1 tablet (25 mg total) by mouth 2 (two) times daily as needed (palpitations).   PRENATAL VIT-FE FUMARATE-FA (MULTIVITAMIN-PRENATAL) 27-0.8 MG TABS TABLET     Take 1 tablet by mouth daily at 12 noon.  Modified Medications   Modified Medication Previous Medication   METHIMAZOLE (TAPAZOLE) 5 MG TABLET methimazole (TAPAZOLE) 5 MG tablet      Take 1.5 tablets (7.5 mg total) by mouth daily.    Take 1 tablet (5 mg total) by mouth daily.  Discontinued Medications   No medications on file    Past Medical History Past Medical History:  Diagnosis Date   Hypertension    Lichen planus 2014   back, forearms, and legs   Overweight(278.02)    Preterm labor     Past Surgical History Past Surgical History:  Procedure Laterality Date   US ECHOCARDIOGRAPHY  10/2014   WNL, mildly dilated LV, EFF 55-60%    Family History family history includes Breast cancer (age of onset: 71) in her paternal aunt and paternal grandmother; CAD in her maternal grandmother; Diabetes in her father; Heart attack (age of onset: 39) in her maternal grandmother; Hypertension in her father and paternal grandmother.  Social History Social History   Socioeconomic History   Marital status: Married    Spouse name: Not on file   Number of children: 1   Years of education: Not on file   Highest education level: Not on file  Occupational History   Occupation: RN    Employer: [employer]  Tobacco Use   Smoking status: Never   Smokeless tobacco: Never  Substance and Sexual Activity   Alcohol use: No    Alcohol/week: 0.0 standard drinks of alcohol    Comment: Occasional  Drug use: No   Sexual activity: Not on file  Other Topics Concern   Not on file  Social History Narrative   3-4 cups coffee/day   Lives with husband and 1 healthy daughter at home (21 week preemie,2007), lost twin son,ab other twin pregnacy   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[employer], finishing NP school   Edu: finishing NP school   Activity: gym, has Systems analyst   Diet: good water, fruits/vegetables daily, weight watchers   Social Determinants of Corporate investment banker Strain: Not on file  Food Insecurity:  Not on file  Transportation Needs: Not on file  Physical Activity: Not on file  Stress: Not on file  Social Connections: Unknown (01/28/2022)   Received from [facility]   Social Network    Social Network: Not on file  Intimate Partner Violence: Unknown (01/02/2022)   Received from [facility]   HITS    Physically Hurt: Not on file    Insult or Talk Down To: Not on file    Threaten Physical Harm: Not on file    Scream or Curse: Not on file    Laboratory Investigations Lab Results  Component Value Date   TSH <0.01 (L) 08/19/2023   TSH 0.00 Repeated and verified X2. (L) 07/22/2023   TSH 1.12 04/29/2022   FREET4 1.53 08/19/2023   FREET4 2.04 (H) 07/22/2023   FREET4 0.87 10/11/2014   Component     Latest Ref Rng 08/19/2023  Triiodothyronine,Free,Serum     2.3 - 4.2 pg/mL 4.5 (H)     Lab Results  Component Value Date   TSI 323 (H) 07/28/2023     No components found for: "TRAB"   Lab Results  Component Value Date   CHOL 182 04/29/2022   Lab Results  Component Value Date   HDL 74.50 04/29/2022   Lab Results  Component Value Date   LDLCALC 88 04/29/2022   Lab Results  Component Value Date   TRIG 98.0 04/29/2022   Lab Results  Component Value Date   CHOLHDL 2 04/29/2022   Lab Results  Component Value Date   CREATININE 0.71 04/29/2022   Lab Results  Component Value Date   GFR 103.59 04/29/2022      Component Value Date/Time   NA 137 04/29/2022 0817   K 4.0 04/29/2022 0817   CL 102 04/29/2022 0817   CO2 26 04/29/2022 0817   GLUCOSE 90 04/29/2022 0817   BUN 9 04/29/2022 0817   CREATININE 0.71 04/29/2022 0817   CALCIUM 9.2 04/29/2022 0817   PROT 7.0 05/02/2021 0805   ALBUMIN 4.2 05/02/2021 0805   AST 17 05/02/2021 0805   ALT 17 05/02/2021 0805   ALKPHOS 53 05/02/2021 0805   BILITOT 0.6 05/02/2021 0805   GFRNONAA 86.80 09/03/2010 0841      Latest Ref Rng & Units 04/29/2022    8:17 AM 05/02/2021    8:05 AM 04/25/2020     8:16 AM  BMP  Glucose 70 - 99 mg/dL 90  83  84   BUN 6 - 23 mg/dL 9  12  9    Creatinine 0.40 - 1.20 mg/dL 8.29  5.62  1.30   Sodium 135 - 145 mEq/L 137  134  135   Potassium 3.5 - 5.1 mEq/L 4.0  3.9  4.1   Chloride 96 - 112 mEq/L 102  104  104   CO2 19 - 32 mEq/L 26  21  25    Calcium 8.4 -  10.5 mg/dL 9.2  9.0  9.2        Component Value Date/Time   WBC 6.2 07/28/2023 1448   RBC 4.72 07/28/2023 1448   HGB 12.5 07/28/2023 1448   HCT 39.7 07/28/2023 1448   PLT 293.0 07/28/2023 1448   MCV 84.2 07/28/2023 1448   MCHC 31.5 07/28/2023 1448   RDW 13.8 07/28/2023 1448   LYMPHSABS 1.8 07/28/2023 1448   MONOABS 0.7 07/28/2023 1448   EOSABS 0.2 07/28/2023 1448   BASOSABS 0.0 07/28/2023 1448      Parts of this note may have been dictated using voice recognition software. There may be variances in spelling and vocabulary which are unintentional. Not all errors are proofread. Please notify the Thereasa Parkin if any discrepancies are noted or if the meaning of any statement is not clear.

## 2023-09-29 ENCOUNTER — Ambulatory Visit
Admission: RE | Admit: 2023-09-29 | Discharge: 2023-09-29 | Disposition: A | Discharge status: Home / Self Care | Payer: 59 | Source: Ambulatory Visit | Attending: Obstetrics and Gynecology | Admitting: Obstetrics and Gynecology

## 2023-09-29 DIAGNOSIS — Z0000 Encounter for general adult medical examination without abnormal findings: Secondary | ICD-10-CM

## 2023-10-04 ENCOUNTER — Other Ambulatory Visit: Payer: Self-pay | Admitting: Family Medicine

## 2023-10-04 DIAGNOSIS — E66811 Obesity, class 1: Secondary | ICD-10-CM

## 2023-10-04 DIAGNOSIS — E538 Deficiency of other specified B group vitamins: Secondary | ICD-10-CM

## 2023-10-06 ENCOUNTER — Other Ambulatory Visit (INDEPENDENT_AMBULATORY_CARE_PROVIDER_SITE_OTHER): Payer: 59

## 2023-10-06 DIAGNOSIS — E538 Deficiency of other specified B group vitamins: Secondary | ICD-10-CM | POA: Diagnosis not present

## 2023-10-06 DIAGNOSIS — E66811 Obesity, class 1: Secondary | ICD-10-CM | POA: Diagnosis not present

## 2023-10-06 LAB — VITAMIN B12: Vitamin B-12: 549 pg/mL (ref 211–911)

## 2023-10-06 LAB — COMPREHENSIVE METABOLIC PANEL
ALT: 17 U/L (ref 0–35)
AST: 22 U/L (ref 0–37)
Albumin: 4.1 g/dL (ref 3.5–5.2)
Alkaline Phosphatase: 81 U/L (ref 39–117)
BUN: 9 mg/dL (ref 6–23)
CO2: 26 meq/L (ref 19–32)
Calcium: 8.8 mg/dL (ref 8.4–10.5)
Chloride: 103 meq/L (ref 96–112)
Creatinine, Ser: 0.82 mg/dL (ref 0.40–1.20)
GFR: 86.27 mL/min (ref >60.00)
Glucose, Bld: 81 mg/dL (ref 70–99)
Potassium: 4.3 meq/L (ref 3.5–5.1)
Sodium: 135 meq/L (ref 135–145)
Total Bilirubin: 0.5 mg/dL (ref 0.2–1.2)
Total Protein: 7 g/dL (ref 6.0–8.3)

## 2023-10-06 LAB — LIPID PANEL
Cholesterol: 175 mg/dL (ref 0–200)
HDL: 57.6 mg/dL (ref >39.00)
LDL Cholesterol: 104 mg/dL — ABNORMAL HIGH (ref 0–99)
NonHDL: 117.75
Total CHOL/HDL Ratio: 3
Triglycerides: 69 mg/dL (ref 0.0–149.0)
VLDL: 13.8 mg/dL (ref 0.0–40.0)

## 2023-10-07 ENCOUNTER — Other Ambulatory Visit: Payer: 59

## 2023-10-10 ENCOUNTER — Other Ambulatory Visit: Payer: Self-pay | Admitting: Obstetrics and Gynecology

## 2023-10-10 DIAGNOSIS — Z803 Family history of malignant neoplasm of breast: Secondary | ICD-10-CM

## 2023-10-14 ENCOUNTER — Encounter: Payer: Self-pay | Admitting: Family Medicine

## 2023-10-14 ENCOUNTER — Ambulatory Visit: Payer: 59 | Admitting: Family Medicine

## 2023-10-14 VITALS — BP 134/78 | HR 95 | Temp 98.5°F | Ht 64.25 in | Wt 186.1 lb

## 2023-10-14 DIAGNOSIS — E0590 Thyrotoxicosis, unspecified without thyrotoxic crisis or storm: Secondary | ICD-10-CM

## 2023-10-14 DIAGNOSIS — Z1211 Encounter for screening for malignant neoplasm of colon: Secondary | ICD-10-CM

## 2023-10-14 DIAGNOSIS — Z0000 Encounter for general adult medical examination without abnormal findings: Secondary | ICD-10-CM

## 2023-10-14 DIAGNOSIS — I493 Ventricular premature depolarization: Secondary | ICD-10-CM

## 2023-10-14 DIAGNOSIS — E282 Polycystic ovarian syndrome: Secondary | ICD-10-CM | POA: Diagnosis not present

## 2023-10-14 DIAGNOSIS — N912 Amenorrhea, unspecified: Secondary | ICD-10-CM

## 2023-10-14 MED ORDER — METOPROLOL TARTRATE 25 MG PO TABS: 25.0000 mg | ORAL_TABLET | Freq: Two times a day (BID) | ORAL | 3 refills | Status: AC | PRN

## 2023-10-14 NOTE — Progress Notes (Signed)
 Ph: [phone] Fax: [phone]   Patient ID: Jasmine [place], female    DOB: 04/10/1976, 48 y.o.   MRN: [id number]  This visit was conducted in person.  BP 134/78   Pulse 95   Temp 98.5 F (36.9 C) (Oral)   Ht 5' 4.25 (1.632 m)   Wt 186 lb 2 oz (84.4 kg)   LMP 09/24/2023   SpO2 98%   BMI 31.70 kg/m    CC: CPE Subjective:   HPI: Jasmine Dixon is a 48 y.o. female presenting on 10/14/2023 for Annual Exam   Newly diagnosed Graves disease while getting set up for IVF process.  Established with [facility] endocrinology Dr Dartha now on methimazole  7.5mg  daily as well as metoprolol  25mg  BID PRN palpitations.   Diagnosed PCOS started on high dose metformin but currently not taking.  Recent endometrial biopsy this morning for endometrial inflammation s/p cipro/flagyl course.  She's been taking estrogen and progesterone as well as lupron, menopur.   Was on Wegovy  2023/2024.   Preventative: Colon cancer screening - would like colonoscopy  Well woman at [facility] with Dr Sarrah - sees yearly (Summer 2024). H/o abnl paps, removed abnl cells s/p remote LEEP. Normal pap since then. Mammogram 10/2023 - [facility] - pending MRI through GYN Flu shot - did not get this year  COVID vaccine Pfizer 11/2019 x2, booster 09/16/2020 Tdap - 09/2012, 2022  Seat belt use discussed.  Sunscreen use discussed. No changing moles on skin.  Sleep - averaging 6 hours/night  Non smoker  Alcohol - social  Dentist Q30mo Eye exam - yearly LMP 09/24/2023, regular cycles.  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 Interim medical  history since our last visit reviewed. Allergies and medications reviewed and updated. Outpatient Medications Prior to Visit  Medication Sig Dispense Refill   co-enzyme Q-10 30 MG capsule Take 30 mg by mouth 3 (three) times daily.     methimazole  (TAPAZOLE ) 5 MG tablet Take 1.5 tablets (7.5 mg total) by mouth daily. 135 tablet 1   Prenatal Vit-Fe Fumarate-FA (MULTIVITAMIN-PRENATAL) 27-0.8 MG TABS tablet Take 1 tablet by mouth daily at 12 noon.     metoprolol  tartrate (LOPRESSOR ) 25 MG tablet Take 1 tablet (25 mg total) by mouth 2 (two) times daily as needed (palpitations). 30 tablet 3   metFORMIN (GLUMETZA) 1000 MG (MOD) 24 hr tablet Take 1,000 mg by mouth daily with breakfast. 1000mg  in the am 500mg  at night/pcos     No facility-administered medications prior to visit.     Per HPI unless specifically indicated in ROS section below Review of Systems  Constitutional:  Negative for activity change, appetite change, chills, fatigue, fever and unexpected weight change.  HENT:  Negative for hearing loss.   Eyes:  Negative for visual disturbance.  Respiratory:  Negative for cough, chest tightness, shortness of breath and wheezing.   Cardiovascular:  Negative for chest pain, palpitations and leg swelling.  Gastrointestinal:  Positive for constipation (med related - manages with OTC). Negative for abdominal distention, abdominal pain, blood in stool, diarrhea, nausea and vomiting.  Genitourinary:  Negative for difficulty urinating and hematuria.  Musculoskeletal:  Negative for arthralgias, myalgias and neck pain.  Skin:  Negative for rash.  Neurological:  Negative for dizziness, seizures, syncope and headaches.  Hematological:  Negative for adenopathy. Does not bruise/bleed easily.  Psychiatric/Behavioral:  Negative for dysphoric mood. The patient is not nervous/anxious.     Objective:  BP 134/78   Pulse 95   Temp 98.5 F (36.9 C) (Oral)   Ht 5' 4.25 (1.632 m)   Wt 186 lb 2 oz (84.4 kg)    LMP 09/24/2023   SpO2 98%   BMI 31.70 kg/m   Wt Readings from Last 3 Encounters:  10/14/23 186 lb 2 oz (84.4 kg)  07/28/23 182 lb 3.2 oz (82.6 kg)  11/18/22 176 lb 2 oz (79.9 kg)      Physical Exam Vitals and nursing note reviewed.  Constitutional:      Appearance: Normal appearance. She is not ill-appearing.  HENT:     Head: Normocephalic and atraumatic.     Right Ear: Tympanic membrane, ear canal and external ear normal. There is no impacted cerumen.     Left Ear: Tympanic membrane, ear canal and external ear normal. There is no impacted cerumen.     Mouth/Throat:     Mouth: Mucous membranes are moist.     Pharynx: Oropharynx is clear. No oropharyngeal exudate or posterior oropharyngeal erythema.  Eyes:     General:        Right eye: No discharge.        Left eye: No discharge.     Extraocular Movements: Extraocular movements intact.     Conjunctiva/sclera: Conjunctivae normal.     Pupils: Pupils are equal, round, and reactive to light.  Neck:     Thyroid : No thyroid  mass or thyromegaly.  Cardiovascular:     Rate and Rhythm: Normal rate and regular rhythm.     Pulses: Normal pulses.     Heart sounds: Normal heart sounds. No murmur heard. Pulmonary:     Effort: Pulmonary effort is normal. No respiratory distress.     Breath sounds: Normal breath sounds. No wheezing, rhonchi or rales.  Abdominal:     General: Bowel sounds are normal. There is no distension.     Palpations: Abdomen is soft. There is no mass.     Tenderness: There is no abdominal tenderness. There is no guarding or rebound.     Hernia: No hernia is present.  Musculoskeletal:     Cervical back: Normal range of motion and neck supple. No rigidity.     Right lower leg: No edema.     Left lower leg: No edema.  Lymphadenopathy:     Cervical: No cervical adenopathy.  Skin:    General: Skin is warm and dry.     Findings: No rash.  Neurological:     General: No focal deficit present.     Mental Status:  She is alert. Mental status is at baseline.  Psychiatric:        Mood and Affect: Mood normal.        Behavior: Behavior normal.       Results for orders placed or performed in visit on 10/06/23  Vitamin B12   Collection Time: 10/06/23 12:06 PM  Result Value Ref Range   Vitamin B-12 549 211 - 911 pg/mL  Comprehensive metabolic panel   Collection Time: 10/06/23 12:06 PM  Result Value Ref Range   Sodium 135 135 - 145 mEq/L   Potassium 4.3 3.5 - 5.1 mEq/L   Chloride  103 96 - 112 mEq/L   CO2 26 19 - 32 mEq/L   Glucose, Bld 81 70 - 99 mg/dL   BUN 9 6 - 23 mg/dL   Creatinine, Ser 9.17 0.40 - 1.20 mg/dL   Total Bilirubin 0.5 0.2 - 1.2 mg/dL   Alkaline Phosphatase 81 39 - 117 U/L   AST 22 0 - 37 U/L   ALT 17 0 - 35 U/L   Total Protein 7.0 6.0 - 8.3 g/dL   Albumin 4.1 3.5 - 5.2 g/dL   GFR 13.72 >39.99 mL/min   Calcium 8.8 8.4 - 10.5 mg/dL  Lipid panel   Collection Time: 10/06/23 12:06 PM  Result Value Ref Range   Cholesterol 175 0 - 200 mg/dL   Triglycerides 30.9 0.0 - 149.0 mg/dL   HDL 42.39 >60.99 mg/dL   VLDL 86.1 0.0 - 59.9 mg/dL   LDL Cholesterol 895 (H) 0 - 99 mg/dL   Total CHOL/HDL Ratio 3    NonHDL 117.75     Assessment & Plan:   Problem List Items Addressed This Visit     Health maintenance examination - Primary (Chronic)   Preventative protocols reviewed and updated unless pt declined. Discussed healthy diet and lifestyle.       Symptomatic PVCs   Relevant Medications   metoprolol  tartrate (LOPRESSOR ) 25 MG tablet   Amenorrhea   Undergoing IVF through reproductive endo.       Hyperthyroidism   Appreciate endo care, now on methimazole  7.5mg  daily with rare metoprolol  use.  TSH-r Ab normal, TSI Ab elevated (normally TSH-R Ab elevated in Graves).       Relevant Medications   metoprolol  tartrate (LOPRESSOR ) 25 MG tablet   PCOS (polycystic ovarian syndrome)   Seeing reproductive endocrinology, was on metformin.       Other Visit Diagnoses        Special screening for malignant neoplasms, colon       Relevant Orders   Ambulatory referral to Gastroenterology        Meds ordered this encounter  Medications   metoprolol  tartrate (LOPRESSOR ) 25 MG tablet    Sig: Take 1 tablet (25 mg total) by mouth 2 (two) times daily as needed (palpitations).    Dispense:  30 tablet    Refill:  3    Orders Placed This Encounter  Procedures   Ambulatory referral to Gastroenterology    Referral Priority:   Routine    Referral Type:   Consultation    Referral Reason:   Specialty Services Required    Number of Visits Requested:   1    Patient Instructions  Work on low chol diet to keep levels well controlled (fiber, legumes).  Good to see you today Return as needed or in 1 year for next physical.   Follow up plan: Return in about 1 year (around 10/13/2024) for annual exam, prior fasting for blood work.  Anton Blas, MD

## 2023-10-14 NOTE — Assessment & Plan Note (Addendum)
 Seeing reproductive endocrinology, was on metformin.

## 2023-10-14 NOTE — Patient Instructions (Addendum)
 Work on low chol diet to keep levels well controlled (fiber, legumes).  Good to see you today Return as needed or in 1 year for next physical.

## 2023-10-14 NOTE — Assessment & Plan Note (Signed)
 Preventative protocols reviewed and updated unless pt declined. Discussed healthy diet and lifestyle.

## 2023-10-14 NOTE — Assessment & Plan Note (Addendum)
 Appreciate endo care, now on methimazole 7.5mg  daily with rare metoprolol use.  TSH-r Ab normal, TSI Ab elevated (normally TSH-R Ab elevated in [place]).

## 2023-10-14 NOTE — Assessment & Plan Note (Signed)
 Undergoing IVF through reproductive endo.

## 2023-10-30 ENCOUNTER — Encounter: Payer: Self-pay | Admitting: Gastroenterology

## 2023-11-05 ENCOUNTER — Ambulatory Visit (AMBULATORY_SURGERY_CENTER): Payer: 59

## 2023-11-05 VITALS — Ht 64.25 in | Wt 180.0 lb

## 2023-11-05 DIAGNOSIS — Z1211 Encounter for screening for malignant neoplasm of colon: Secondary | ICD-10-CM

## 2023-11-05 MED ORDER — SUFLAVE 178.7 G PO SOLR: 1.0000 | ORAL | 0 refills | Status: DC

## 2023-11-05 NOTE — Progress Notes (Signed)

## 2023-11-16 ENCOUNTER — Ambulatory Visit
Admission: RE | Admit: 2023-11-16 | Discharge: 2023-11-16 | Disposition: A | Discharge status: Home / Self Care | Payer: 59 | Source: Ambulatory Visit | Attending: Obstetrics and Gynecology | Admitting: Obstetrics and Gynecology

## 2023-11-16 DIAGNOSIS — Z803 Family history of malignant neoplasm of breast: Secondary | ICD-10-CM

## 2023-11-16 MED ORDER — GADOPICLENOL 0.5 MMOL/ML IV SOLN
8.0000 mL | Freq: Once | INTRAVENOUS | Status: AC | PRN
  Administered 2023-11-16: 8 mL via INTRAVENOUS

## 2023-11-18 ENCOUNTER — Encounter: Payer: Self-pay | Admitting: "Endocrinology

## 2023-11-19 ENCOUNTER — Other Ambulatory Visit: Payer: 59

## 2023-11-21 ENCOUNTER — Encounter: Payer: Self-pay | Admitting: Gastroenterology

## 2023-11-24 ENCOUNTER — Other Ambulatory Visit: Payer: 59

## 2023-11-25 ENCOUNTER — Encounter: Payer: Self-pay | Admitting: Gastroenterology

## 2023-11-25 ENCOUNTER — Ambulatory Visit: Payer: 59 | Admitting: Gastroenterology

## 2023-11-25 VITALS — BP 152/93 | HR 78 | Temp 97.3°F | Resp 14 | Ht 64.25 in | Wt 180.0 lb

## 2023-11-25 DIAGNOSIS — K648 Other hemorrhoids: Secondary | ICD-10-CM

## 2023-11-25 DIAGNOSIS — Z1211 Encounter for screening for malignant neoplasm of colon: Secondary | ICD-10-CM

## 2023-11-25 DIAGNOSIS — K635 Polyp of colon: Secondary | ICD-10-CM

## 2023-11-25 DIAGNOSIS — K644 Residual hemorrhoidal skin tags: Secondary | ICD-10-CM | POA: Diagnosis not present

## 2023-11-25 DIAGNOSIS — D125 Benign neoplasm of sigmoid colon: Secondary | ICD-10-CM

## 2023-11-25 LAB — T4, FREE: Free T4: 0.9 ng/dL (ref 0.8–1.8)

## 2023-11-25 LAB — TSH: TSH: 3.17 m[IU]/L

## 2023-11-25 LAB — T3, FREE: T3, Free: 3.1 pg/mL (ref 2.3–4.2)

## 2023-11-25 MED ORDER — SODIUM CHLORIDE 0.9 % IV SOLN: 500.0000 mL | Freq: Once | INTRAVENOUS | Status: DC

## 2023-11-25 NOTE — Progress Notes (Unsigned)
 Sedate, gd SR, tolerated procedure well, VSS, report to RN

## 2023-11-25 NOTE — Progress Notes (Signed)
 Called to room to assist during endoscopic procedure.  Patient ID and intended procedure confirmed with present staff. Received instructions for my participation in the procedure from the performing physician.

## 2023-11-25 NOTE — Op Note (Signed)
 [facility] Patient Name: Jasmine Dixon Procedure Date: 11/25/2023 10:52 AM MRN: [id number] Endoscopist: Napoleon Form , MD, [id number] Age: 48 Referring MD:  Date of Birth: 11-07-1975 Gender: Female Account #: [id number] Procedure:                Colonoscopy Indications:              Screening for colorectal malignant neoplasm Medicines:                Monitored Anesthesia Care Procedure:                Pre-Anesthesia Assessment:                           - Prior to the procedure, a History and Physical                            was performed, and patient medications and                            allergies were reviewed. The patient's tolerance of                            previous anesthesia was also reviewed. The risks                            and benefits of the procedure and the sedation                            options and risks were discussed with the patient.                            All questions were answered, and informed consent                            was obtained. Prior Anticoagulants: The patient has                            taken no anticoagulant or antiplatelet agents. ASA                            Grade Assessment: II - A patient with mild systemic                            disease. After reviewing the risks and benefits,                            the patient was deemed in satisfactory condition to                            undergo the procedure.                           After obtaining informed consent, the colonoscope  was passed under direct vision. Throughout the                            procedure, the patient's blood pressure, pulse, and                            oxygen saturations were monitored continuously. The                            Olympus Scope SN: [id number] was introduced through                            the anus and advanced to the the cecum, identified                            by  appendiceal orifice and ileocecal valve. The                            colonoscopy was performed without difficulty. The                            patient tolerated the procedure well. The quality                            of the bowel preparation was good. The ileocecal                            valve, appendiceal orifice, and rectum were                            photographed. Scope In: 10:56:02 AM Scope Out: 11:15:04 AM Scope Withdrawal Time: 0 hours 11 minutes 54 seconds  Total Procedure Duration: 0 hours 19 minutes 2 seconds  Findings:                 The perianal and digital rectal examinations were                            normal.                           A 4 mm polyp was found in the sigmoid colon. The                            polyp was sessile. The polyp was removed with a                            cold snare. Resection and retrieval were complete.                           Non-bleeding external and internal hemorrhoids were                            found during retroflexion. The hemorrhoids were  small. Complications:            No immediate complications. Estimated Blood Loss:     Estimated blood loss was minimal. Impression:               - One 4 mm polyp in the sigmoid colon, removed with                            a cold snare. Resected and retrieved.                           - Non-bleeding external and internal hemorrhoids. Recommendation:           - Patient has a contact number available for                            emergencies. The signs and symptoms of potential                            delayed complications were discussed with the                            patient. Return to normal activities tomorrow.                            Written discharge instructions were provided to the                            patient.                           - Resume previous diet.                           - Continue present medications.                            - Await pathology results.                           - Repeat colonoscopy in 5-10 years for surveillance                            based on pathology results. Napoleon Form, MD 11/25/2023 11:20:13 AM This report has been signed electronically.

## 2023-11-25 NOTE — Patient Instructions (Signed)

## 2023-11-25 NOTE — Progress Notes (Unsigned)
 [facility] History and Physical   Primary Care Physician:  Eustaquio Boyden, MD   Reason for Procedure:  Colorectal cancer screening  Plan:    Screening colonoscopy with possible interventions as needed     HPI: Jasmine Dixon is a very pleasant 48 y.o. female here for screening colonoscopy. Denies any nausea, vomiting, abdominal pain, melena or bright red blood per rectum  The risks and benefits as well as alternatives of endoscopic procedure(s) have been discussed and reviewed. All questions answered. The patient agrees to proceed.    Past Medical History:  Diagnosis Date   GERD (gastroesophageal reflux disease)    Hypertension    Lichen planus 2014   back, forearms, and legs   Overweight(278.02)    Preterm labor    Thyroid disease    Hyperthyroidism    Past Surgical History:  Procedure Laterality Date   US ECHOCARDIOGRAPHY  10/2014   WNL, mildly dilated LV, EFF 55-60%    Prior to Admission medications   Medication Sig Start Date End Date Taking? Authorizing Provider  methimazole (TAPAZOLE) 5 MG tablet Take 1.5 tablets (7.5 mg total) by mouth daily. 08/26/23 11/25/23 Yes Motwani, Komal, MD  co-enzyme Q-10 30 MG capsule Take 30 mg by mouth 3 (three) times daily.    [provider]  metoprolol tartrate (LOPRESSOR) 25 MG tablet Take 1 tablet (25 mg total) by mouth 2 (two) times daily as needed (palpitations). 10/14/23   Eustaquio Boyden, MD  Prenatal Vit-Fe Fumarate-FA (MULTIVITAMIN-PRENATAL) 27-0.8 MG TABS tablet Take 1 tablet by mouth daily at 12 noon.    [provider]    Current Outpatient Medications  Medication Sig Dispense Refill   methimazole (TAPAZOLE) 5 MG tablet Take 1.5 tablets (7.5 mg total) by mouth daily. 135 tablet 1   co-enzyme Q-10 30 MG capsule Take 30 mg by mouth 3 (three) times daily.     metoprolol tartrate (LOPRESSOR) 25 MG tablet Take 1 tablet (25 mg total) by mouth 2 (two) times daily as needed  (palpitations). 30 tablet 3   Prenatal Vit-Fe Fumarate-FA (MULTIVITAMIN-PRENATAL) 27-0.8 MG TABS tablet Take 1 tablet by mouth daily at 12 noon.     Current Facility-Administered Medications  Medication Dose Route Frequency Provider Last Rate Last Admin   0.9 %  sodium chloride infusion  500 mL Intravenous Once Napoleon Form, MD        Allergies as of 11/25/2023   (No Known Allergies)    Family History  Problem Relation Age of Onset   Diabetes Father    Hypertension Father    Breast cancer Paternal Aunt 83   CAD Maternal Grandmother    Heart attack Maternal Grandmother 30   Breast cancer Paternal Grandmother 4   Hypertension Paternal Grandmother    Cancer Paternal Grandmother    Colon cancer Neg Hx    Rectal cancer Neg Hx    Stomach cancer Neg Hx     Social History   Socioeconomic History   Marital status: Married    Spouse name: Not on file   Number of children: 1   Years of education: Not on file   Highest education level: Master's degree (e.g., MA, MS, MEng, MEd, MSW, MBA)  Occupational History   Occupation: Teacher, adult education: [employer]  Tobacco Use   Smoking status: Never   Smokeless tobacco: Never  Substance and Sexual Activity   Alcohol use: No    Comment: Occasional   Drug use: No   Sexual  activity: Yes    Birth control/protection: None  Other Topics Concern   Not on file  Social History Narrative   3-4 cups coffee/day   Lives with husband and 1 healthy daughter at home (21 week preemie,2007), lost twin son,ab other twin pregnacy   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[employer], finishing NP school   Edu: finishing NP school   Activity: gym, has Systems analyst   Diet: good water, fruits/vegetables daily, weight watchers   Social Drivers of Corporate investment banker Strain: Low Risk  (10/13/2023)   Overall Financial Resource Strain (CARDIA)    Difficulty of Paying Living Expenses: Not hard at all  Food Insecurity: No Food Insecurity (10/13/2023)   Hunger Vital Sign     Worried About Running Out of Food in the Last Year: Never true    Ran Out of Food in the Last Year: Never true  Transportation Needs: No Transportation Needs (10/13/2023)   PRAPARE - Administrator, Civil Service (Medical): No    Lack of Transportation (Non-Medical): No  Physical Activity: Insufficiently Active (10/13/2023)   Exercise Vital Sign    Days of Exercise per Week: 3 days    Minutes of Exercise per Session: 40 min  Stress: No Stress Concern Present (10/13/2023)   [employer] of Occupational Health - Occupational Stress Questionnaire    Feeling of Stress : Only a little  Social Connections: Socially Integrated (10/13/2023)   Social Connection and Isolation Panel [NHANES]    Frequency of Communication with Friends and Family: More than three times a week    Frequency of Social Gatherings with Friends and Family: Once a week    Attends Religious Services: More than 4 times per year    Active Member of [employer] or Organizations: Yes    Attends Banker Meetings: More than 4 times per year    Marital Status: Married  Catering manager Violence: Unknown (01/02/2022)   Received from [facility], [facility]   HITS    Physically Hurt: Not on file    Insult or Talk Down To: Not on file    Threaten Physical Harm: Not on file    Scream or Curse: Not on file    Review of Systems:  All other review of systems negative except as mentioned in the HPI.  Physical Exam: Vital signs in last 24 hours: BP 134/78   Pulse 83   Temp (!) 97.3 F (36.3 C)   Ht 5' 4.25" (1.632 m)   Wt 180 lb (81.6 kg)   LMP 10/17/2023 (Exact Date)   SpO2 99%   BMI 30.66 kg/m  General:   Alert, NAD Lungs:  Clear .   Heart:  Regular rate and rhythm Abdomen:  Soft, nontender and nondistended. Neuro/Psych:  Alert and cooperative. Normal mood and affect. A and O x 3  Reviewed labs, radiology imaging, old records and pertinent past GI work up  Patient is appropriate for  planned procedure(s) and anesthesia in an ambulatory setting   K. Scherry Ran , MD [phone]

## 2023-11-26 ENCOUNTER — Telehealth: Payer: Self-pay | Admitting: *Deleted

## 2023-11-26 NOTE — Telephone Encounter (Signed)
  Follow up Call-     11/25/2023   10:36 AM  Call back number  Post procedure Call Back phone  # [phone]  Permission to leave phone message Yes     Patient questions:  Do you have a fever, pain , or abdominal swelling? No. Pain Score  0 *  Have you tolerated food without any problems? Yes.    Have you been able to return to your normal activities? Yes.    Do you have any questions about your discharge instructions: Diet   No. Medications  No. Follow up visit  No.  Do you have questions or concerns about your Care? No.  Actions: * If pain score is 4 or above: No action needed, pain <4.

## 2023-11-27 LAB — SURGICAL PATHOLOGY

## 2024-01-04 ENCOUNTER — Other Ambulatory Visit: Payer: Self-pay | Admitting: "Endocrinology

## 2024-01-05 ENCOUNTER — Encounter: Payer: Self-pay | Admitting: "Endocrinology

## 2024-01-05 ENCOUNTER — Other Ambulatory Visit: Payer: Self-pay

## 2024-01-05 DIAGNOSIS — E0590 Thyrotoxicosis, unspecified without thyrotoxic crisis or storm: Secondary | ICD-10-CM

## 2024-01-05 MED ORDER — METHIMAZOLE 5 MG PO TABS: 7.5000 mg | ORAL_TABLET | Freq: Every day | ORAL | 0 refills | Status: DC

## 2024-01-06 MED ORDER — METHIMAZOLE 5 MG PO TABS
7.5000 mg | ORAL_TABLET | Freq: Every day | ORAL | 0 refills | Status: DC
Start: 2024-01-06 — End: 2024-04-20

## 2024-01-06 NOTE — Telephone Encounter (Signed)
 Pt needs to be schedule for an appt, pt hasn't been seen since 07/2023.     Requested Prescriptions   Pending Prescriptions Disp Refills   methimazole (TAPAZOLE) 5 MG tablet 45 tablet 0    Sig: Take 1.5 tablets (7.5 mg total) by mouth daily.

## 2024-01-13 NOTE — Telephone Encounter (Addendum)
 Lmx2 to call office to schedule follow up.

## 2024-01-13 NOTE — Telephone Encounter (Signed)
 Patient returned call about scheduling appointment, but she is in [place]  and will not be returning to [place]  until 01/17/2024.

## 2024-02-05 ENCOUNTER — Encounter: Payer: Self-pay | Admitting: Gastroenterology

## 2024-02-13 ENCOUNTER — Encounter: Payer: Self-pay | Admitting: "Endocrinology

## 2024-02-16 ENCOUNTER — Encounter: Payer: Self-pay | Admitting: "Endocrinology

## 2024-02-16 ENCOUNTER — Telehealth (INDEPENDENT_AMBULATORY_CARE_PROVIDER_SITE_OTHER): Admitting: "Endocrinology

## 2024-02-16 DIAGNOSIS — E0500 Thyrotoxicosis with diffuse goiter without thyrotoxic crisis or storm: Secondary | ICD-10-CM | POA: Diagnosis not present

## 2024-02-16 DIAGNOSIS — E049 Nontoxic goiter, unspecified: Secondary | ICD-10-CM

## 2024-02-16 NOTE — Patient Instructions (Signed)
  If you notice any symptoms of worsening fatigue, fever with sore throat, loss of appetite, yellowing of eyes, dark urine, joint pains, sores in the mouth, itchy rash, light colored stools or abdominal pain, please stop the medication and call us immediately as this can be a serious side effect of the medication.

## 2024-02-16 NOTE — Progress Notes (Signed)
 The patient reports they are currently: Jasmine Dixon. I spent 6 minutes on the video with the patient on the date of service. I spent an additional 5 minutes on pre- and post-visit activities on the date of service.   The patient was physically located in [place]  or a state in which I am permitted to provide care. The patient and/or parent/guardian understood that s/he may incur co-pays and cost sharing, and agreed to the telemedicine visit. The visit was reasonable and appropriate under the circumstances given the patient's presentation at the time.  The patient and/or parent/guardian has been advised of the potential risks and limitations of this mode of treatment (including, but not limited to, the absence of in-person examination) and has agreed to be treated using telemedicine. The patient's/patient's family's questions regarding telemedicine have been answered.   The patient and/or parent/guardian has also been advised to contact their provider's office for worsening conditions, and seek emergency medical treatment and/or call 911 if the patient deems either necessary.      Outpatient Endocrinology Note Jasmine Newcomer, MD  02/16/24   Jasmine Dixon [birth year] [id number]  Referring Provider: Claire Crick, MD Primary Care Provider: Claire Crick, MD Subjective  No chief complaint on file.   Assessment & Plan  Diagnoses and all orders for this visit:  Graves disease -     TSH -     T4, free -     T3, free  Goiter   Jasmine Dixon is currently on methimazole  7.5 mg once a day. Started methimazole  5 mg once a day in 07/2023. Patient is currently biochemically hyperthyroid.  Patient is on IVF. Discussed at length about PTU and methimazole , associated S/E and and given written names to discuss with Infertility specialists.   Discussed the etiology for hyperthyroidism. Educated on thyroid  axis.  Recommend the following: Take methimazole  7.5 mg once a day. Labs  today. Repeat labs in 3 months or sooner if symptoms of hyper or hypothyroidism develop.  Educated on definitive options of treatment including RAI therapy and surgery. Patient does not want RAI at this time.  Counseled on: -complications of untreated hyperthyroidism including atrial fibrillation, heart failure and osteoporosis -side effects of Methimazole  including but not limited to allergic reaction, rash, bone marrow suppression, liver dysfunction and teratogenic potential -implications in pregnancy and breastfeeding -compliance and follow up needs   If you notice any symptoms of worsening fatigue, fever with sore throat, loss of appetite, yellowing of eyes, dark urine, joint pains, sores in the mouth, itchy rash, light colored stools or abdominal pain, please stop the medication and call us  immediately as this can be a serious side effect of the medication.  Thyromegaly noted on exam 2016 thyroid  U/S reported thyromegaly with no nodules 08/2023 thyroid  U/S reported thyromegaly with markedly heterogeneous thyroid  gland with background scattered subcentimeter pseudo nodularity Continue monitoring clinically   I have reviewed current medications, nurse's notes, allergies, vital signs, past medical and surgical history, family medical history, and social history for this encounter. Counseled patient on symptoms, examination findings, lab findings, imaging results, treatment decisions and monitoring and prognosis. The patient understood the recommendations and agrees with the treatment plan. All questions regarding treatment plan were fully answered.   Return in about 3 months (around 05/18/2024) for visit + labs before next visit, labs now.   Jasmine Newcomer, MD  02/16/24   I have reviewed current medications, nurse's notes, allergies, vital signs, past medical and surgical history, family medical history, and social history  for this encounter. Counseled patient on symptoms, examination  findings, lab findings, imaging results, treatment decisions and monitoring and prognosis. The patient understood the recommendations and agrees with the treatment plan. All questions regarding treatment plan were fully answered.   History of Present Illness Jasmine Dixon is a 48 y.o. year old female who presents to our clinic with hyperthyroidism diagnosed in 06/2023.    Pt has started doing IVF and was found to have +TPO Ab and referred here.   Symptoms suggestive of HYPOTHYROIDISM:  fatigue No weight gain No cold intolerance  No constipation  No  Symptoms suggestive of HYPERTHYROIDISM:  weight loss  No heat intolerance No, resolved  hyperdefecation  No palpitations  No, resolved  Compressive symptoms:  dysphagia  No dysphonia  No positional dyspnea (especially with simultaneous arms elevation)  No  Smokes  No On biotin  No Personal history of head/neck surgery/irradiation  No   Adverse Drug Effects from Methimazole  (MMI): rash No fever No throat pain No arthritis No mouth ulcers No jaundice No loss of appetite No lymphadenopathy No  No grave's eye disease symptoms    [facility] labs:   Physical Exam  There were no vitals taken for this visit. Constitutional: well developed, well nourished Head: normocephalic, atraumatic, no exophthalmos Eyes: sclera anicteric, no redness Neck: + thyromegaly, no thyroid  tenderness Lungs: normal respiratory effort Neurology: alert and oriented, no fine hand tremor Skin: dry, no appreciable rashes Musculoskeletal: no appreciable defects Psychiatric: normal mood and affect  Allergies No Known Allergies  Current Medications Patient's Medications  New Prescriptions   No medications on file  Previous Medications   CO-ENZYME Q-10 30 MG CAPSULE    Take 30 mg by mouth 3 (three) times daily.   METHIMAZOLE  (TAPAZOLE ) 5 MG TABLET    Take 1.5 tablets (7.5 mg total) by mouth daily.   METOPROLOL  TARTRATE (LOPRESSOR ) 25 MG  TABLET    Take 1 tablet (25 mg total) by mouth 2 (two) times daily as needed (palpitations).   PRENATAL VIT-FE FUMARATE-FA (MULTIVITAMIN-PRENATAL) 27-0.8 MG TABS TABLET    Take 1 tablet by mouth daily at 12 noon.  Modified Medications   No medications on file  Discontinued Medications   No medications on file    Past Medical History Past Medical History:  Diagnosis Date   GERD (gastroesophageal reflux disease)    Hypertension    Lichen planus 2014   back, forearms, and legs   Overweight(278.02)    Preterm labor    Thyroid  disease    Hyperthyroidism    Past Surgical History Past Surgical History:  Procedure Laterality Date   US  ECHOCARDIOGRAPHY  10/2014   WNL, mildly dilated LV, EFF 55-60%    Family History family history includes Breast cancer (age of onset: 27) in her paternal aunt and paternal grandmother; CAD in her maternal grandmother; Cancer in her paternal grandmother; Diabetes in her father; Heart attack (age of onset: 3) in her maternal grandmother; Hypertension in her father and paternal grandmother.  Social History Social History   Socioeconomic History   Marital status: Married    Spouse name: Not on file   Number of children: 1   Years of education: Not on file   Highest education level: Master's degree (e.g., MA, MS, MEng, MEd, MSW, MBA)  Occupational History   Occupation: Teacher, adult education: [employer]  Tobacco Use   Smoking status: Never   Smokeless tobacco: Never  Substance and Sexual Activity   Alcohol use: No  Comment: Occasional   Drug use: No   Sexual activity: Yes    Birth control/protection: None  Other Topics Concern   Not on file  Social History Narrative   3-4 cups coffee/day   Lives with husband and 1 healthy daughter at home (21 week preemie,2007), lost twin son,ab other twin pregnacy   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[employer], finishing NP school   Edu: finishing NP school   Activity: gym, has Systems analyst   Diet: good water, fruits/vegetables  daily, weight watchers   Social Drivers of Corporate investment banker Strain: Low Risk  (10/13/2023)   Overall Financial Resource Strain (CARDIA)    Difficulty of Paying Living Expenses: Not hard at all  Food Insecurity: No Food Insecurity (10/13/2023)   Hunger Vital Sign    Worried About Running Out of Food in the Last Year: Never true    Ran Out of Food in the Last Year: Never true  Transportation Needs: No Transportation Needs (10/13/2023)   PRAPARE - Administrator, Civil Service (Medical): No    Lack of Transportation (Non-Medical): No  Physical Activity: Insufficiently Active (10/13/2023)   Exercise Vital Sign    Days of Exercise per Week: 3 days    Minutes of Exercise per Session: 40 min  Stress: No Stress Concern Present (10/13/2023)   [employer] of Occupational Health - Occupational Stress Questionnaire    Feeling of Stress : Only a little  Social Connections: Socially Integrated (10/13/2023)   Social Connection and Isolation Panel [NHANES]    Frequency of Communication with Friends and Family: More than three times a week    Frequency of Social Gatherings with Friends and Family: Once a week    Attends Religious Services: More than 4 times per year    Active Member of [employer] or Organizations: Yes    Attends Banker Meetings: More than 4 times per year    Marital Status: Married  Catering manager Violence: Unknown (01/02/2022)   Received from [facility], [facility]   HITS    Physically Hurt: Not on file    Insult or Talk Down To: Not on file    Threaten Physical Harm: Not on file    Scream or Curse: Not on file    Laboratory Investigations Lab Results  Component Value Date   TSH 3.17 11/24/2023   TSH <0.01 (L) 08/19/2023   TSH 0.00 Repeated and verified X2. (L) 07/22/2023   FREET4 0.9 11/24/2023   FREET4 1.53 08/19/2023   FREET4 2.04 (H) 07/22/2023   Component     Latest Ref Rng 08/19/2023  Triiodothyronine,Free,Serum     2.3 -  4.2 pg/mL 4.5 (H)     Lab Results  Component Value Date   TSI 323 (H) 07/28/2023     No components found for: "TRAB"   Lab Results  Component Value Date   CHOL 175 10/06/2023   Lab Results  Component Value Date   HDL 57.60 10/06/2023   Lab Results  Component Value Date   LDLCALC 104 (H) 10/06/2023   Lab Results  Component Value Date   TRIG 69.0 10/06/2023   Lab Results  Component Value Date   CHOLHDL 3 10/06/2023   Lab Results  Component Value Date   CREATININE 0.82 10/06/2023   Lab Results  Component Value Date   GFR 86.27 10/06/2023      Component Value Date/Time   NA 135 10/06/2023 1206   K 4.3 10/06/2023 1206  CL 103 10/06/2023 1206   CO2 26 10/06/2023 1206   GLUCOSE 81 10/06/2023 1206   BUN 9 10/06/2023 1206   CREATININE 0.82 10/06/2023 1206   CALCIUM 8.8 10/06/2023 1206   PROT 7.0 10/06/2023 1206   ALBUMIN 4.1 10/06/2023 1206   AST 22 10/06/2023 1206   ALT 17 10/06/2023 1206   ALKPHOS 81 10/06/2023 1206   BILITOT 0.5 10/06/2023 1206   GFRNONAA 86.80 09/03/2010 0841      Latest Ref Rng & Units 10/06/2023   12:06 PM 04/29/2022    8:17 AM 05/02/2021    8:05 AM  BMP  Glucose 70 - 99 mg/dL 81  90  83   BUN 6 - 23 mg/dL 9  9  12    Creatinine 0.40 - 1.20 mg/dL 4.40  3.47  4.25   Sodium 135 - 145 mEq/L 135  137  134   Potassium 3.5 - 5.1 mEq/L 4.3  4.0  3.9   Chloride 96 - 112 mEq/L 103  102  104   CO2 19 - 32 mEq/L 26  26  21    Calcium 8.4 - 10.5 mg/dL 8.8  9.2  9.0        Component Value Date/Time   WBC 6.2 07/28/2023 1448   RBC 4.72 07/28/2023 1448   HGB 12.5 07/28/2023 1448   HCT 39.7 07/28/2023 1448   PLT 293.0 07/28/2023 1448   MCV 84.2 07/28/2023 1448   MCHC 31.5 07/28/2023 1448   RDW 13.8 07/28/2023 1448   LYMPHSABS 1.8 07/28/2023 1448   MONOABS 0.7 07/28/2023 1448   EOSABS 0.2 07/28/2023 1448   BASOSABS 0.0 07/28/2023 1448      Parts of this note may have been dictated using voice recognition software. There may be variances in  spelling and vocabulary which are unintentional. Not all errors are proofread. Please notify the [facility] if any discrepancies are noted or if the meaning of any statement is not clear.

## 2024-04-09 ENCOUNTER — Other Ambulatory Visit

## 2024-04-09 ENCOUNTER — Encounter: Payer: Self-pay | Admitting: "Endocrinology

## 2024-04-09 LAB — T4, FREE: Free T4: 1.5 ng/dL (ref 0.8–1.8)

## 2024-04-09 LAB — T3, FREE: T3, Free: 2.8 pg/mL (ref 2.3–4.2)

## 2024-04-09 LAB — TSH: TSH: 1.17 m[IU]/L

## 2024-04-12 ENCOUNTER — Ambulatory Visit: Payer: Self-pay | Admitting: "Endocrinology

## 2024-04-13 NOTE — Progress Notes (Signed)
 ATC x1 to schedule appt

## 2024-04-20 ENCOUNTER — Other Ambulatory Visit: Payer: Self-pay | Admitting: "Endocrinology

## 2024-04-20 DIAGNOSIS — E0590 Thyrotoxicosis, unspecified without thyrotoxic crisis or storm: Secondary | ICD-10-CM

## 2024-08-06 ENCOUNTER — Encounter: Payer: Self-pay | Admitting: "Endocrinology

## 2024-08-06 ENCOUNTER — Ambulatory Visit (INDEPENDENT_AMBULATORY_CARE_PROVIDER_SITE_OTHER): Admitting: "Endocrinology

## 2024-08-06 ENCOUNTER — Other Ambulatory Visit

## 2024-08-06 VITALS — BP 114/80 | HR 84 | Ht 64.25 in | Wt 183.0 lb

## 2024-08-06 DIAGNOSIS — E010 Iodine-deficiency related diffuse (endemic) goiter: Secondary | ICD-10-CM

## 2024-08-06 DIAGNOSIS — E0500 Thyrotoxicosis with diffuse goiter without thyrotoxic crisis or storm: Secondary | ICD-10-CM

## 2024-08-06 NOTE — Progress Notes (Signed)
 Outpatient Endocrinology Note Jasmine [facility], MD  08/06/24   [facility] 1976/08/17 [id number]  Referring Provider: Rilla Baller, MD Primary Care Provider: Rilla Baller, MD Subjective  No chief complaint on file.   Assessment & Plan  Diagnoses and all orders for this visit:  Graves disease -     TSH -     T4, free -     T3, free -     Thyroid  stimulating immunoglobulin -     TRAb (TSH Receptor Binding Antibody)  Thyromegaly    Suzen MATSU Evrard is currently on methimazole  7.5 mg once a day. Started methimazole  5 mg once a day in 07/2023. Patient is currently biochemically hyperthyroid.  Patient is on IVF. Discussed at length about PTU and methimazole , associated S/E and and given written names to discuss with Infertility specialists.   Discussed the etiology for hyperthyroidism. Educated on thyroid  axis.  Recommend the following: Take methimazole  7.5 mg once a day. Labs today. Repeat labs in 3 months or sooner if symptoms of hyper or hypothyroidism develop.  Educated on definitive options of treatment including RAI therapy and surgery. Patient does not want RAI at this time.  Counseled on: -complications of untreated hyperthyroidism including atrial fibrillation, heart failure and osteoporosis -side effects of Methimazole  including but not limited to allergic reaction, rash, bone marrow suppression, liver dysfunction and teratogenic potential -implications in pregnancy and breastfeeding -compliance and follow up needs   If you notice any symptoms of worsening fatigue, fever with sore throat, loss of appetite, yellowing of eyes, dark urine, joint pains, sores in the mouth, itchy rash, light colored stools or abdominal pain, please stop the medication and call us  immediately as this can be a serious side effect of the medication.  Thyromegaly noted on exam 2016 thyroid  U/S reported thyromegaly with no nodules 08/2023 thyroid  U/S reported  thyromegaly with markedly heterogeneous thyroid  gland with background scattered subcentimeter pseudo nodularity Continue monitoring clinically   I have reviewed current medications, nurse's notes, allergies, vital signs, past medical and surgical history, family medical history, and social history for this encounter. Counseled patient on symptoms, examination findings, lab findings, imaging results, treatment decisions and monitoring and prognosis. The patient understood the recommendations and agrees with the treatment plan. All questions regarding treatment plan were fully answered.   Return for visit + labs before next visit, labs today.   Jasmine Birmingham, MD  08/06/24   I have reviewed current medications, nurse's notes, allergies, vital signs, past medical and surgical history, family medical history, and social history for this encounter. Counseled patient on symptoms, examination findings, lab findings, imaging results, treatment decisions and monitoring and prognosis. The patient understood the recommendations and agrees with the treatment plan. All questions regarding treatment plan were fully answered.   History of Present Illness J[facility] is a 48 y.o. year old female who presents to our clinic with hyperthyroidism diagnosed in 06/2023.    Pt has started doing IVF and was found to have +TPO Ab and referred here.   Symptoms suggestive of HYPOTHYROIDISM:  fatigue No weight gain No cold intolerance  No constipation  No  Symptoms suggestive of HYPERTHYROIDISM:  weight loss  No heat intolerance No, resolved  hyperdefecation  No palpitations  No, resolved  Compressive symptoms:  dysphagia  No dysphonia  No positional dyspnea (especially with simultaneous arms elevation)  No  Smokes  No On biotin  No Personal history of head/neck surgery/irradiation  No   Adverse Drug  Effects from Methimazole  (MMI): rash No fever No throat pain No arthritis No mouth ulcers  No jaundice No loss of appetite No lymphadenopathy No  No grave's eye disease symptoms    [facility] labs:   Physical Exam  BP 114/80   Pulse 84   Ht 5' 4.25 (1.632 m)   Wt 183 lb (83 kg)   SpO2 95%   BMI 31.17 kg/m  Constitutional: well developed, well nourished Head: normocephalic, atraumatic, no exophthalmos Eyes: sclera anicteric, no redness Neck: + thyromegaly, no thyroid  tenderness, nodular appearance Lungs: normal respiratory effort Neurology: alert and oriented, no fine hand tremor Skin: dry, no appreciable rashes Musculoskeletal: no appreciable defects Psychiatric: normal mood and affect  Allergies No Known Allergies  Current Medications Patient's Medications  New Prescriptions   No medications on file  Previous Medications   CO-ENZYME Q-10 30 MG CAPSULE    Take 30 mg by mouth 3 (three) times daily.   METHIMAZOLE  (TAPAZOLE ) 5 MG TABLET    TAKE 1 AND 1/2 TABLETS(7.5 MG) BY MOUTH DAILY   METOPROLOL  TARTRATE (LOPRESSOR ) 25 MG TABLET    Take 1 tablet (25 mg total) by mouth 2 (two) times daily as needed (palpitations).   PRENATAL VIT-FE FUMARATE-FA (MULTIVITAMIN-PRENATAL) 27-0.8 MG TABS TABLET    Take 1 tablet by mouth daily at 12 noon.  Modified Medications   No medications on file  Discontinued Medications   No medications on file    Past Medical History Past Medical History:  Diagnosis Date   GERD (gastroesophageal reflux disease)    Hypertension    Lichen planus 2014   back, forearms, and legs   Overweight(278.02)    Preterm labor    Thyroid  disease    Hyperthyroidism    Past Surgical History Past Surgical History:  Procedure Laterality Date   US  ECHOCARDIOGRAPHY  10/2014   WNL, mildly dilated LV, EFF 55-60%    Family History family history includes Breast cancer (age of onset: 80) in her paternal aunt and paternal grandmother; CAD in her maternal grandmother; Cancer in her paternal grandmother; Diabetes in her father; Heart attack (age of  onset: 54) in her maternal grandmother; Hypertension in her father and paternal grandmother.  Social History Social History   Socioeconomic History   Marital status: Married    Spouse name: Not on file   Number of children: 1   Years of education: Not on file   Highest education level: Master's degree (e.g., MA, MS, MEng, MEd, MSW, MBA)  Occupational History   Occupation: Teacher, Adult Education: [place]  Tobacco Use   Smoking status: Never   Smokeless tobacco: Never  Substance and Sexual Activity   Alcohol use: No    Comment: Occasional   Drug use: No   Sexual activity: Yes    Birth control/protection: None  Other Topics Concern   Not on file  Social History Narrative   3-4 cups coffee/day   Lives with husband and 1 healthy daughter at home (21 week preemie,2007), lost twin son,ab other twin pregnacy   Occ: [employer] at [employer], finishing NP school   Edu: finishing NP school   Activity: gym, has systems analyst   Diet: good water, fruits/vegetables daily, weight watchers   Social Drivers of Corporate Investment Banker Strain: Low Risk  (10/13/2023)   Overall Financial Resource Strain (CARDIA)    Difficulty of Paying Living Expenses: Not hard at all  Food Insecurity: No Food Insecurity (10/13/2023)   Hunger Vital Sign  Worried About Programme Researcher, Broadcasting/film/video in the Last Year: Never true    Ran Out of Food in the Last Year: Never true  Transportation Needs: No Transportation Needs (10/13/2023)   PRAPARE - Administrator, Civil Service (Medical): No    Lack of Transportation (Non-Medical): No  Physical Activity: Insufficiently Active (10/13/2023)   Exercise Vital Sign    Days of Exercise per Week: 3 days    Minutes of Exercise per Session: 40 min  Stress: No Stress Concern Present (10/13/2023)   [employer] of Occupational Health - Occupational Stress Questionnaire    Feeling of Stress : Only a little  Social Connections: Socially Integrated (10/13/2023)   Social  Connection and Isolation Panel    Frequency of Communication with Friends and Family: More than three times a week    Frequency of Social Gatherings with Friends and Family: Once a week    Attends Religious Services: More than 4 times per year    Active Member of Clubs or Organizations: Yes    Attends Banker Meetings: More than 4 times per year    Marital Status: Married  Intimate Partner Violence: Unknown (01/02/2022)   Received from [facility]   HITS    Physically Hurt: Not on file    Insult or Talk Down To: Not on file    Threaten Physical Harm: Not on file    Scream or Curse: Not on file    Laboratory Investigations Lab Results  Component Value Date   TSH 1.17 04/09/2024   TSH 3.17 11/24/2023   TSH <0.01 (L) 08/19/2023   FREET4 1.5 04/09/2024   FREET4 0.9 11/24/2023   FREET4 1.53 08/19/2023   Component     Latest Ref Rng 08/19/2023  Triiodothyronine,Free,Serum     2.3 - 4.2 pg/mL 4.5 (H)     Lab Results  Component Value Date   TSI 323 (H) 07/28/2023     No components found for: TRAB   Lab Results  Component Value Date   CHOL 175 10/06/2023   Lab Results  Component Value Date   HDL 57.60 10/06/2023   Lab Results  Component Value Date   LDLCALC 104 (H) 10/06/2023   Lab Results  Component Value Date   TRIG 69.0 10/06/2023   Lab Results  Component Value Date   CHOLHDL 3 10/06/2023   Lab Results  Component Value Date   CREATININE 0.82 10/06/2023   Lab Results  Component Value Date   GFR 86.27 10/06/2023      Component Value Date/Time   NA 135 10/06/2023 1206   K 4.3 10/06/2023 1206   CL 103 10/06/2023 1206   CO2 26 10/06/2023 1206   GLUCOSE 81 10/06/2023 1206   BUN 9 10/06/2023 1206   CREATININE 0.82 10/06/2023 1206   CALCIUM 8.8 10/06/2023 1206   PROT 7.0 10/06/2023 1206   ALBUMIN 4.1 10/06/2023 1206   AST 22 10/06/2023 1206   ALT 17 10/06/2023 1206   ALKPHOS 81 10/06/2023 1206   BILITOT 0.5 10/06/2023 1206   GFRNONAA  86.80 09/03/2010 0841      Latest Ref Rng & Units 10/06/2023   12:06 PM 04/29/2022    8:17 AM 05/02/2021    8:05 AM  BMP  Glucose 70 - 99 mg/dL 81  90  83   BUN 6 - 23 mg/dL 9  9  12    Creatinine 0.40 - 1.20 mg/dL 9.17  9.28  9.24   Sodium 135 -  145 mEq/L 135  137  134   Potassium 3.5 - 5.1 mEq/L 4.3  4.0  3.9   Chloride 96 - 112 mEq/L 103  102  104   CO2 19 - 32 mEq/L 26  26  21    Calcium 8.4 - 10.5 mg/dL 8.8  9.2  9.0        Component Value Date/Time   WBC 6.2 07/28/2023 1448   RBC 4.72 07/28/2023 1448   HGB 12.5 07/28/2023 1448   HCT 39.7 07/28/2023 1448   PLT 293.0 07/28/2023 1448   MCV 84.2 07/28/2023 1448   MCHC 31.5 07/28/2023 1448   RDW 13.8 07/28/2023 1448   LYMPHSABS 1.8 07/28/2023 1448   MONOABS 0.7 07/28/2023 1448   EOSABS 0.2 07/28/2023 1448   BASOSABS 0.0 07/28/2023 1448      Parts of this note may have been dictated using voice recognition software. There may be variances in spelling and vocabulary which are unintentional. Not all errors are proofread. Please notify the dino if any discrepancies are noted or if the meaning of any statement is not clear.

## 2024-08-06 NOTE — Patient Instructions (Signed)
  If you notice any symptoms of worsening fatigue, fever with sore throat, loss of appetite, yellowing of eyes, dark urine, joint pains, sores in the mouth, itchy rash, light colored stools or abdominal pain, please stop the medication and call us immediately as this can be a serious side effect of the medication.

## 2024-08-11 LAB — THYROID STIMULATING IMMUNOGLOBULIN: TSI: <89 %{baseline} (ref <140)

## 2024-08-11 LAB — T3, FREE: T3, Free: 3.2 pg/mL (ref 2.3–4.2)

## 2024-08-11 LAB — T4, FREE: Free T4: 1.1 ng/dL (ref 0.8–1.8)

## 2024-08-11 LAB — TSH: TSH: 1.44 m[IU]/L

## 2024-08-11 LAB — TRAB (TSH RECEPTOR BINDING ANTIBODY): TRAB: <1 IU/L (ref <=2.00)

## 2024-09-27 ENCOUNTER — Other Ambulatory Visit: Payer: Self-pay | Admitting: Family Medicine

## 2024-09-27 DIAGNOSIS — Z1231 Encounter for screening mammogram for malignant neoplasm of breast: Secondary | ICD-10-CM

## 2024-10-10 ENCOUNTER — Other Ambulatory Visit: Payer: Self-pay | Admitting: Family Medicine

## 2024-10-10 DIAGNOSIS — E0590 Thyrotoxicosis, unspecified without thyrotoxic crisis or storm: Secondary | ICD-10-CM

## 2024-10-10 DIAGNOSIS — Z131 Encounter for screening for diabetes mellitus: Secondary | ICD-10-CM

## 2024-10-10 DIAGNOSIS — Z13220 Encounter for screening for lipoid disorders: Secondary | ICD-10-CM

## 2024-10-11 ENCOUNTER — Other Ambulatory Visit (INDEPENDENT_AMBULATORY_CARE_PROVIDER_SITE_OTHER): Payer: 59

## 2024-10-11 DIAGNOSIS — Z13220 Encounter for screening for lipoid disorders: Secondary | ICD-10-CM | POA: Diagnosis not present

## 2024-10-11 DIAGNOSIS — Z136 Encounter for screening for cardiovascular disorders: Secondary | ICD-10-CM

## 2024-10-11 DIAGNOSIS — Z131 Encounter for screening for diabetes mellitus: Secondary | ICD-10-CM

## 2024-10-11 LAB — LIPID PANEL
Cholesterol: 173 mg/dL (ref 28–200)
HDL: 68.9 mg/dL
LDL Cholesterol: 96 mg/dL (ref 10–99)
NonHDL: 103.85
Total CHOL/HDL Ratio: 3
Triglycerides: 40 mg/dL (ref 10.0–149.0)
VLDL: 8 mg/dL (ref 0.0–40.0)

## 2024-10-11 LAB — BASIC METABOLIC PANEL WITH GFR
BUN: 13 mg/dL (ref 6–23)
CO2: 27 meq/L (ref 19–32)
Calcium: 9.4 mg/dL (ref 8.4–10.5)
Chloride: 101 meq/L (ref 96–112)
Creatinine, Ser: 0.87 mg/dL (ref 0.40–1.20)
GFR: 79.78 mL/min
Glucose, Bld: 68 mg/dL — ABNORMAL LOW (ref 70–99)
Potassium: 4.4 meq/L (ref 3.5–5.1)
Sodium: 135 meq/L (ref 135–145)

## 2024-10-12 ENCOUNTER — Ambulatory Visit: Payer: Self-pay | Admitting: Family Medicine

## 2024-10-18 ENCOUNTER — Encounter: Payer: Self-pay | Admitting: Family Medicine

## 2024-10-18 ENCOUNTER — Ambulatory Visit: Payer: 59 | Admitting: Family Medicine

## 2024-10-18 VITALS — BP 118/82 | HR 99 | Temp 98.0°F | Ht 64.5 in | Wt 187.6 lb

## 2024-10-18 DIAGNOSIS — S6010XA Contusion of unspecified finger with damage to nail, initial encounter: Secondary | ICD-10-CM | POA: Insufficient documentation

## 2024-10-18 DIAGNOSIS — N96 Recurrent pregnancy loss: Secondary | ICD-10-CM

## 2024-10-18 DIAGNOSIS — R899 Unspecified abnormal finding in specimens from other organs, systems and tissues: Secondary | ICD-10-CM | POA: Diagnosis not present

## 2024-10-18 DIAGNOSIS — Z0000 Encounter for general adult medical examination without abnormal findings: Secondary | ICD-10-CM

## 2024-10-18 DIAGNOSIS — E559 Vitamin D deficiency, unspecified: Secondary | ICD-10-CM | POA: Diagnosis not present

## 2024-10-18 DIAGNOSIS — Z23 Encounter for immunization: Secondary | ICD-10-CM

## 2024-10-18 DIAGNOSIS — S6010XS Contusion of unspecified finger with damage to nail, sequela: Secondary | ICD-10-CM

## 2024-10-18 DIAGNOSIS — E0590 Thyrotoxicosis, unspecified without thyrotoxic crisis or storm: Secondary | ICD-10-CM | POA: Diagnosis not present

## 2024-10-18 DIAGNOSIS — E0500 Thyrotoxicosis with diffuse goiter without thyrotoxic crisis or storm: Secondary | ICD-10-CM

## 2024-10-18 DIAGNOSIS — E66811 Obesity, class 1: Secondary | ICD-10-CM

## 2024-10-18 LAB — VITAMIN D 25 HYDROXY (VIT D DEFICIENCY, FRACTURES): VITD: 26.42 ng/mL — ABNORMAL LOW (ref 30.00–100.00)

## 2024-10-18 MED ORDER — TIRZEPATIDE-WEIGHT MANAGEMENT 2.5 MG/0.5ML ~~LOC~~ SOLN: 2.5000 mg | SUBCUTANEOUS | 0 refills | Status: AC

## 2024-10-18 MED ORDER — TIRZEPATIDE-WEIGHT MANAGEMENT 5 MG/0.5ML ~~LOC~~ SOLN: 5.0000 mg | SUBCUTANEOUS | 3 refills | Status: AC

## 2024-10-18 NOTE — Progress Notes (Signed)
 " Ph: [phone] Fax: [phone]   Patient ID: Jasmine Dixon, female    DOB: 01/27/1976, 49 y.o.   MRN: [id number]  This visit was conducted in person.  BP 118/82 (BP Location: Left Arm, Patient Position: Sitting, Cuff Size: Normal)   Pulse 99   Temp 98 F (36.7 C) (Oral)   Ht 5' 4.5 (1.638 m)   Wt 187 lb 9.6 oz (85.1 kg)   LMP 10/06/2024 (Exact Date)   SpO2 100%   BMI 31.70 kg/m    CC: CPE Subjective:   HPI: Jasmine Dixon is a 49 y.o. female presenting on 10/18/2024 for Annual Exam (Would like Zepbound  rx  for [facility]/)   Graves disease, sees [facility] endocrin[facility] on methimazole  7.5mg  daily as well as metoprolol  25mg  BID PRN palpitations.   PCOS diagnosis, treated with metformin but currently not taking.  Recent endometrial biopsy this morning for endometrial inflammation s/p cipro/flagyl course.  She's been taking estrogen and progesterone as well as lupron, menopur.    Was on Wegovy  2023/2024. Requests starting Zepbound  through [facility].  Aware of wegovy  use, risks/benefits, need to monitor for muscle loss.  No fmhx MEN2 or MTC.   Notes frequent spontaneous subungual hematomas. No other easy bruising. Notes h/o abnormal testing for phospholipid antibody syndrome - I asked her to bring us  records. (Anticardiolipin IgG/IgM normal, anti-beta2 glycoprotein was elevated). No h/o arterial or venous thrombosis. + miscarriages.   Preventative: Colonoscopy 11/2023 - [facility], int/ext hem, rpt 10 yrs ([facility])  Well woman at [facility], [facility] with [facility] - sees yearly (Summer 2024). H/o abnl paps, removed abnl cells s/p remote LEEP. Normal pap since then.  Mammogram 10/2023 - [facility], breast MRI 11/2023 through GYN also Bir[facility]1. Already scheduled for rpt  Flu shot - rec yearly  COVID vaccine Pfizer 11/2019 x2, booster 09/16/2020 Tdap - 09/2012, 07/2021  Seat belt use discussed.  Sunscreen use discussed. No changing moles on skin.   Sleep - averaging 6 hours/night  Non smoker  Alcohol - social  Dentist Q36mo Eye exam - yearly LMP 10/06/2024, regular cycles. 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 Interim medical history since our last visit reviewed. Allergies and medications reviewed and updated. Outpatient Medications Prior to Visit  Medication Sig Dispense Refill   methimazole  (TAPAZOLE ) 5 MG tablet TAKE 1 AND 1/2 TABLETS(7.5 MG) BY MOUTH DAILY 45 tablet 0   metoprolol  tartrate (LOPRESSOR ) 25 MG tablet Take 1 tablet (25 mg total) by mouth 2 (two) times daily as needed (palpitations). 30 tablet 3   Prenatal Vit-Fe Fumarate-FA (MULTIVITAMIN-PRENATAL) 27-0.8 MG TABS tablet Take 1 tablet by mouth daily at 12 noon.     co-enzyme Q-10 30 MG capsule Take 30 mg by mouth 3 (three) times daily.     No facility-administered medications prior to visit.     Per HPI unless specifically indicated in ROS section below Review of Systems  Constitutional:  Negative for activity change, appetite change, chills, fatigue, fever and unexpected weight change.  HENT:  Negative for hearing loss.   Eyes:  Negative for visual disturbance.  Respiratory:  Negative for cough, chest tightness, shortness of breath and wheezing.   Cardiovascular:  Positive for palpitations (occ - managed with BB). Negative for chest pain and leg swelling.  Gastrointestinal:  Negative for abdominal distention, abdominal pain, blood in stool, constipation, diarrhea, nausea and vomiting.  Genitourinary:  Negative for difficulty urinating and hematuria.  Musculoskeletal:  Negative for arthralgias, myalgias and neck  pain.  Skin:  Negative for rash.  Neurological:  Negative for dizziness, seizures, syncope and headaches.  Hematological:  Negative for adenopathy. Does not bruise/bleed easily.  Psychiatric/Behavioral:  Negative for dysphoric mood. The patient is not nervous/anxious.     Objective:  BP 118/82 (BP Location: Left Arm, Patient Position: Sitting, Cuff Size: Normal)   Pulse 99   Temp 98 F (36.7 C) (Oral)   Ht 5' 4.5 (1.638 m)   Wt 187 lb 9.6 oz (85.1 kg)   LMP 10/06/2024 (Exact Date)   SpO2 100%   BMI 31.70 kg/m   Wt Readings from Last 3 Encounters:  10/18/24 187 lb 9.6 oz (85.1 kg)  08/06/24 183 lb (83 kg)  11/25/23 180 lb (81.6 kg)      Physical Exam Vitals and nursing note reviewed.  Constitutional:      Appearance: Normal appearance. She is not ill-appearing.  HENT:     Head: Normocephalic and atraumatic.     Right Ear: Tympanic membrane, ear canal and external ear normal. There is no impacted cerumen.     Left Ear: Tympanic membrane, ear canal and external ear normal. There is no impacted cerumen.     Mouth/Throat:     Mouth: Mucous membranes are moist.     Pharynx: Oropharynx is clear. No oropharyngeal exudate or posterior oropharyngeal erythema.  Eyes:     General:        Right eye: No discharge.        Left eye: No discharge.     Extraocular Movements: Extraocular movements intact.     Conjunctiva/sclera: Conjunctivae normal.     Pupils: Pupils are equal, round, and reactive to light.  Neck:     Thyroid : No thyroid  mass or thyromegaly.  Cardiovascular:     Rate and Rhythm: Normal rate and regular rhythm.     Pulses: Normal pulses.     Heart sounds: Normal heart sounds. No murmur heard. Pulmonary:     Effort: Pulmonary effort is normal. No respiratory distress.     Breath sounds: Normal breath sounds. No wheezing, rhonchi or rales.  Abdominal:     General: Bowel sounds are normal. There is no distension.     Palpations: Abdomen is soft. There is no mass.      Tenderness: There is no abdominal tenderness. There is no guarding or rebound.     Hernia: No hernia is present.  Musculoskeletal:     Cervical back: Normal range of motion and neck supple. No rigidity.     Right lower leg: No edema.     Left lower leg: No edema.  Lymphadenopathy:     Cervical: No cervical adenopathy.  Skin:    General: Skin is warm and dry.     Findings: No rash.  Neurological:     General: No focal deficit present.     Mental Status: She is alert. Mental status is at baseline.  Psychiatric:        Mood and Affect: Mood normal.        Behavior: Behavior normal.       Results  for orders placed or performed in visit on 10/11/24  Basic metabolic panel with GFR   Collection Time: 10/11/24  8:42 AM  Result Value Ref Range   Sodium 135 135 - 145 mEq/L   Potassium 4.4 3.5 - 5.1 mEq/L   Chloride 101 96 - 112 mEq/L   CO2 27 19 - 32 mEq/L   Glucose, Bld 68 (L) 70 - 99 mg/dL   BUN 13 6 - 23 mg/dL   Creatinine, Ser 9.12 0.40 - 1.20 mg/dL   GFR 20.21 >39.99 mL/min   Calcium 9.4 8.4 - 10.5 mg/dL  Lipid panel   Collection Time: 10/11/24  8:42 AM  Result Value Ref Range   Cholesterol 173 28 - 200 mg/dL   Triglycerides 59.9 89.9 - 149.0 mg/dL   HDL 31.09 >60.99 mg/dL   VLDL 8.0 0.0 - 59.9 mg/dL   LDL Cholesterol 96 10 - 99 mg/dL   Total CHOL/HDL Ratio 3    NonHDL 103.85     Assessment & Plan:   Problem List Items Addressed This Visit     Health maintenance examination - Primary (Chronic)   Preventative protocols reviewed and updated unless pt declined. Discussed healthy diet and lifestyle.       Obesity, Class I, BMI 30-34.9   Patient is interested in GLP1RA/GIP. Reviewed mechanism of action of medication as well as side effects and adverse events to watch for including nausea, diarrhea, constipation, pancreatitis. No fmhx medullary thyroid  cancer or MEN2. Discussed titration schedule for medication. Reviewed importance of strength training to prevent muscle  loss. Will start zepbound  2.5mg  weekly x76mo then increase to 5mg  weekly.       History of multiple miscarriages   Relevant Orders   Antiphospholipid Syndrome Diagnostic Panel   ANA   Hyperthyroidism   Regularly followed by endocrinology, for Graves disease.       Graves disease   Vitamin D  deficiency   H/o this, not on replacement - update levels       Relevant Orders   VITAMIN D  25 Hydroxy (Vit-D Deficiency, Fractures)   Subungual hematoma of digit of hand   Notes frequent recurrent bruising to nails of hands.  She also has h/o elevated anti-beta2 glycoprotein 1 IgG x2 when being evaluated for IVF 2024 (h/o miscarriage).  Will update aPL labs as well as ANA, discussed possible hematology eval pending results.       Relevant Orders   Antiphospholipid Syndrome Diagnostic Panel   ANA   Other Visit Diagnoses       Abnormal laboratory test result       Relevant Orders   Antiphospholipid Syndrome Diagnostic Panel   ANA     Encounter for immunization       Relevant Orders   Flu vaccine trivalent PF, 6mos and older(Flulaval,Afluria,Fluarix,Fluzone) (Completed)        Meds ordered this encounter  Medications   tirzepatide  (ZEPBOUND ) 2.5 MG/0.5ML injection vial    Sig: Inject 2.5 mg into the skin once a week.    Dispense:  2 mL    Refill:  0   tirzepatide  5 MG/0.5ML injection vial    Sig: Inject 5 mg into the skin once a week.    Dispense:  2 mL    Refill:  3    Orders Placed This Encounter  Procedures   Flu vaccine trivalent PF, 6mos and older(Flulaval,Afluria,Fluarix,Fluzone)   Antiphospholipid Syndrome Diagnostic Panel   VITAMIN D  25 Hydroxy (Vit-D Deficiency, Fractures)   ANA  Patient Instructions  Flu shot today  I have sent Zepbound  2.5mg  and 5mg  vials to [facility]  Good to see you today Return as needed or in 1 year for next physical.   Follow up plan: Return in about 1 year (around 10/18/2025) for annual exam, prior fasting for blood  work.  Anton Blas, MD   "

## 2024-10-18 NOTE — Patient Instructions (Addendum)
 Flu shot today  I have sent Zepbound  2.5mg  and 5mg  vials to [facility]  Good to see you today Return as needed or in 1 year for next physical.

## 2024-10-18 NOTE — Assessment & Plan Note (Addendum)
 Notes frequent recurrent bruising to nails of hands.  She also has h/o elevated anti-beta2 glycoprotein 1 IgG x2 when being evaluated for IVF 2024 (h/o miscarriage).  Will update aPL labs as well as ANA, discussed possible hematology eval pending results.

## 2024-10-18 NOTE — Assessment & Plan Note (Addendum)
 H/o this, not on replacement - update levels

## 2024-10-18 NOTE — Assessment & Plan Note (Signed)
 Preventative protocols reviewed and updated unless pt declined. Discussed healthy diet and lifestyle.

## 2024-10-18 NOTE — Assessment & Plan Note (Signed)
 Regularly followed by endocrinology, for Graves disease.

## 2024-10-18 NOTE — Assessment & Plan Note (Signed)
 Patient is interested in GLP1RA/GIP. Reviewed mechanism of action of medication as well as side effects and adverse events to watch for including nausea, diarrhea, constipation, pancreatitis. No fmhx medullary thyroid  cancer or MEN2. Discussed titration schedule for medication. Reviewed importance of strength training to prevent muscle loss. Will start zepbound  2.5mg  weekly x30mo then increase to 5mg  weekly.

## 2024-10-22 LAB — ANTIPHOSPHOLIPID SYNDROME DIAGNOSTIC PANEL
Anticardiolipin IgA: <2 [APL'U]/mL
Anticardiolipin IgG: <2 [GPL'U]/mL
Anticardiolipin IgM: <2 [MPL'U]/mL
Beta-2 Glyco 1 IgA: <2 U/mL
Beta-2 Glyco 1 IgM: <2 U/mL
Beta-2 Glyco I IgG: <2 U/mL
PTT-LA Screen: 38 s
dRVVT: 28 s

## 2024-10-22 LAB — ANA: Anti Nuclear Antibody (ANA): NEGATIVE

## 2024-10-25 ENCOUNTER — Ambulatory Visit: Payer: Self-pay | Admitting: Family Medicine

## 2024-10-25 DIAGNOSIS — N96 Recurrent pregnancy loss: Secondary | ICD-10-CM

## 2024-10-25 MED ORDER — VITAMIN D3 25 MCG (1000 UT) PO CAPS: 1.0000 | ORAL_CAPSULE | Freq: Every day | ORAL | Status: AC

## 2024-11-05 ENCOUNTER — Other Ambulatory Visit: Payer: Self-pay

## 2024-11-29 ENCOUNTER — Other Ambulatory Visit

## 2024-11-29 ENCOUNTER — Ambulatory Visit

## 2024-12-06 ENCOUNTER — Ambulatory Visit: Admitting: "Endocrinology

## 2025-10-12 ENCOUNTER — Other Ambulatory Visit

## 2025-10-19 ENCOUNTER — Encounter: Admitting: Family Medicine
